# Patient Record
Sex: Female | Born: 1965
Health system: Southern US, Community
[De-identification: ages and names within clinical notes are randomized; demographics above are authoritative.]

## PROBLEM LIST (undated history)

## (undated) DIAGNOSIS — R011 Cardiac murmur, unspecified: Secondary | ICD-10-CM

## (undated) DIAGNOSIS — Z86010 Personal history of colonic polyps: Principal | ICD-10-CM

## (undated) DIAGNOSIS — I10 Essential (primary) hypertension: Secondary | ICD-10-CM

## (undated) DIAGNOSIS — I809 Phlebitis and thrombophlebitis of unspecified site: Secondary | ICD-10-CM

## (undated) DIAGNOSIS — K219 Gastro-esophageal reflux disease without esophagitis: Secondary | ICD-10-CM

## (undated) DIAGNOSIS — T7840XA Allergy, unspecified, initial encounter: Secondary | ICD-10-CM

## (undated) HISTORY — DX: Gastro-esophageal reflux disease without esophagitis: K21.9

## (undated) HISTORY — DX: Phlebitis and thrombophlebitis of unspecified site: I80.9

## (undated) HISTORY — DX: Cardiac murmur, unspecified: R01.1

## (undated) HISTORY — PX: OTHER SURGICAL HISTORY: SHX169

## (undated) HISTORY — DX: Essential (primary) hypertension: I10

## (undated) HISTORY — DX: Personal history of colonic polyps: Z86.010

## (undated) HISTORY — PX: ABDOMINAL HYSTERECTOMY: SHX81

## (undated) HISTORY — DX: Allergy, unspecified, initial encounter: T78.40XA

---

## 1997-10-05 ENCOUNTER — Emergency Department (HOSPITAL_COMMUNITY): Admission: EM | Admit: 1997-10-05 | Discharge: 1997-10-05 | Payer: Self-pay | Admitting: Emergency Medicine

## 1997-10-09 ENCOUNTER — Other Ambulatory Visit: Admission: RE | Admit: 1997-10-09 | Discharge: 1997-10-09 | Payer: Self-pay | Admitting: Family Medicine

## 1998-10-20 ENCOUNTER — Other Ambulatory Visit: Admission: RE | Admit: 1998-10-20 | Discharge: 1998-10-20 | Payer: Self-pay | Admitting: Family Medicine

## 1998-10-27 ENCOUNTER — Encounter: Payer: Self-pay | Admitting: Thoracic Surgery

## 1998-10-29 ENCOUNTER — Ambulatory Visit (HOSPITAL_COMMUNITY): Admission: RE | Admit: 1998-10-29 | Discharge: 1998-10-29 | Payer: Self-pay | Admitting: Thoracic Surgery

## 2000-10-26 ENCOUNTER — Other Ambulatory Visit: Admission: RE | Admit: 2000-10-26 | Discharge: 2000-10-26 | Payer: Self-pay | Admitting: Family Medicine

## 2001-10-07 ENCOUNTER — Emergency Department (HOSPITAL_COMMUNITY): Admission: EM | Admit: 2001-10-07 | Discharge: 2001-10-07 | Payer: Self-pay

## 2001-10-17 ENCOUNTER — Observation Stay (HOSPITAL_COMMUNITY): Admission: RE | Admit: 2001-10-17 | Discharge: 2001-10-18 | Payer: Self-pay | Admitting: Obstetrics and Gynecology

## 2003-10-27 ENCOUNTER — Ambulatory Visit (HOSPITAL_COMMUNITY): Admission: RE | Admit: 2003-10-27 | Discharge: 2003-10-27 | Payer: Self-pay | Admitting: Gastroenterology

## 2012-04-10 ENCOUNTER — Other Ambulatory Visit: Payer: Self-pay | Admitting: Obstetrics and Gynecology

## 2013-04-11 ENCOUNTER — Other Ambulatory Visit: Payer: Self-pay | Admitting: Obstetrics and Gynecology

## 2013-04-22 ENCOUNTER — Other Ambulatory Visit: Payer: Self-pay | Admitting: Obstetrics and Gynecology

## 2013-04-22 DIAGNOSIS — R928 Other abnormal and inconclusive findings on diagnostic imaging of breast: Secondary | ICD-10-CM

## 2013-05-14 ENCOUNTER — Ambulatory Visit
Admission: RE | Admit: 2013-05-14 | Discharge: 2013-05-14 | Disposition: A | Discharge status: Home / Self Care | Payer: BC Managed Care – PPO | Source: Ambulatory Visit | Attending: Obstetrics and Gynecology | Admitting: Obstetrics and Gynecology

## 2013-05-14 DIAGNOSIS — R928 Other abnormal and inconclusive findings on diagnostic imaging of breast: Secondary | ICD-10-CM

## 2013-05-16 ENCOUNTER — Other Ambulatory Visit: Payer: Self-pay

## 2013-10-20 ENCOUNTER — Ambulatory Visit (INDEPENDENT_AMBULATORY_CARE_PROVIDER_SITE_OTHER): Payer: BC Managed Care – PPO | Admitting: Emergency Medicine

## 2013-10-20 VITALS — BP 124/70 | HR 72 | Temp 98.8°F | Resp 18 | Ht 63.0 in | Wt 168.0 lb

## 2013-10-20 DIAGNOSIS — L639 Alopecia areata, unspecified: Secondary | ICD-10-CM

## 2013-10-20 LAB — POCT SKIN KOH: SKIN KOH, POC: NEGATIVE

## 2013-10-20 LAB — POCT CBC
Granulocyte percent: 47 %G (ref 37–80)
HEMATOCRIT: 36.6 % — AB (ref 37.7–47.9)
HEMOGLOBIN: 11.6 g/dL — AB (ref 12.2–16.2)
LYMPH, POC: 1.6 (ref 0.6–3.4)
MCH, POC: 31.4 pg — AB (ref 27–31.2)
MCHC: 31.7 g/dL — AB (ref 31.8–35.4)
MCV: 99 fL — AB (ref 80–97)
MID (cbc): 0.3 (ref 0–0.9)
MPV: 7.9 fL (ref 0–99.8)
POC GRANULOCYTE: 1.6 — AB (ref 2–6.9)
POC LYMPH %: 45.7 % (ref 10–50)
POC MID %: 7.3 %M (ref 0–12)
Platelet Count, POC: 386 10*3/uL (ref 142–424)
RBC: 3.7 M/uL — AB (ref 4.04–5.48)
RDW, POC: 12.8 %
WBC: 3.5 10*3/uL — AB (ref 4.6–10.2)

## 2013-10-20 LAB — VITAMIN B12: Vitamin B-12: 523 pg/mL (ref 211–911)

## 2013-10-20 LAB — FERRITIN: Ferritin: 112 ng/mL (ref 10–291)

## 2013-10-20 LAB — TSH: TSH: 1.696 u[IU]/mL (ref 0.350–4.500)

## 2013-10-20 LAB — GLUCOSE, POCT (MANUAL RESULT ENTRY): POC GLUCOSE: 86 mg/dL (ref 70–99)

## 2013-10-20 LAB — POCT SEDIMENTATION RATE: POCT SED RATE: 40 mm/h — AB (ref 0–22)

## 2013-10-20 MED ORDER — FLUOCINONIDE 0.05 % EX GEL: 1.0000 "application " | Freq: Two times a day (BID) | CUTANEOUS | Status: DC

## 2013-10-20 NOTE — Progress Notes (Addendum)
   Subjective:    Patient ID: Haley Flores, female    DOB: 08-26-1965, 48 y.o.   MRN: [id number]  HPI 48 year old female patient presents with alopecia. She noticed it yesterday when she was showering. She has little patches on her scalp from where she has lost hair. Has never noticed hair loss before.    Review of Systems     Objective:   Physical Exam there are patches of alopecia areata on the top of the head and side of the head. There does not appear to be any cracking of the hairs themselves there is no scaling of the scalp there does appear to be some atrophy of the skin in these areas        Results for orders placed in visit on 10/20/13  POCT CBC      Result Value Ref Range   WBC 3.5 (*) 4.6 - 10.2 K/uL   Lymph, poc 1.6  0.6 - 3.4   POC LYMPH PERCENT 45.7  10 - 50 %L   MID (cbc) 0.3  0 - 0.9   POC MID % 7.3  0 - 12 %M   POC Granulocyte 1.6 (*) 2 - 6.9   Granulocyte percent 47.0  37 - 80 %G   RBC 3.70 (*) 4.04 - 5.48 M/uL   Hemoglobin 11.6 (*) 12.2 - 16.2 g/dL   HCT, POC 36.6 (*) 37.7 - 47.9 %   MCV 99.0 (*) 80 - 97 fL   MCH, POC 31.4 (*) 27 - 31.2 pg   MCHC 31.7 (*) 31.8 - 35.4 g/dL   RDW, POC 12.8     Platelet Count, POC 386  142 - 424 K/uL   MPV 7.9  0 - 99.8 fL  GLUCOSE, POCT (MANUAL RESULT ENTRY)      Result Value Ref Range   POC Glucose 86  70 - 99 mg/dl  POCT SKIN KOH      Result Value Ref Range   Skin KOH, POC Negative      Assessment & Plan:  Patient presents with typical alopecia areata. Routine labs were done. Referral made to dermatology will treat with Lidex gel pending her Derm appt. Will also advise a multivitamin with iron. She was given a Lidex gel to apply twice a day pending her appointment with the dermatologist. Her sedimentation rate is 40 so certainly she could have some type of inflammatory disorder

## 2013-10-20 NOTE — Patient Instructions (Signed)
Alopecia Areata Alopecia areata is a self-destructing (autoimmune) disease that results in the loss of hair. In this condition your body's immune system attacks the hair follicle. The hair follicle is responsible for growing hair. Hair loss can occur on the scalp and other parts of the body. It usually starts as one or more small, round, smooth patches of hair loss. It occurs in males and females of all ages and races, but usually starts before age 49. The scalp is the most commonly affected area, but the beard or any hair-bearing site can be affected. This type of hair loss does not leave scars where the hair was lost.  Many people with alopecia areata only have a few patches of hair loss. In others, extensive patchy hair loss occurs. In a few people, all scalp hair is lost (alopecia totalis), or hair is lost from the entire scalp and body (alopecia universalis). No matter how widespread the hair loss, the hair follicles remain alive and are ready to resume normal hair production whenever they receive the correct signal. Hair re-growth may occur without treatment and can even restart after years of hair loss.  CAUSES  It is thought that something triggers the immune system to stop hair growth. It is not always known what the cause is. Some people have genetic markers that can increase the chance of developing alopecia areata. Alopecia areata often occurs in families whose members have had:  Asthma.  Hay fever.  Atopic eczema.  Some autoimmune diseases may also be a trigger, such as:  Thyroid disease.  Diabetes.  Rheumatoid arthritis.  Lupus erythematosus.  Vitiligo.  Pernicious anemia.  Addison's disease. OTHER SYMPTOMS In some people, the nail beds may develop rows of tiny dents (stippling) or the nail beds can become distorted. Other than the hair and nail beds, no other body part is affected.  PROGNOSIS  Alopecia areata is not medically disabling. People with alopecia areata are  usually in excellent health. Hair loss can be emotionally difficult. The National Alopecia Areata Foundation has resources available to help individuals and families with alopecia areata. Their goal is to help people with the condition live full, productive lives. There are many successful, well-adjusted, contented people living with Alopecia areata. Alopecia areata can be overcome with:  A positive self image.  Sound medical facts.  The support of others, such as:  Sometimes professional counseling is helpful to develop one's self-confidence and positive self-image. TREATMENT  There is no cure for alopecia areata. There are several available treatments. Treatments are most effective in milder cases. No treatment is effective for everyone. Choice of treatment depends mainly on a person's age and the extent of their hair loss. Alopecia areata occurs in two forms:   A mild patchy form where less than 50 percent of scalp hair is lost.  An extensive form where greater than 50 percent of scalp hair is lost. These two forms of alopecia areata behave quite differently, and the choice of treatment depends on which form is present. Current treatments do not turn alopecia areata off. They can stimulate the hair follicle to produce hair.  Some medications used to treat mild cases include:  Cortisone injections. The most common treatment is the injection of cortisone into the bare skin patches. The injections are usually given by a caregiver specializing in skin issues (dermatologist). This caregiver will use a tiny needle to give multiple injections into the skin in and around the bare patches. The injections are repeated once a month.   If new hair growth occurs, it is usually visible within 4 weeks. Treatment does not prevent new patches of hair loss from developing. There are few side effects from local cortisone injections. Occasionally, temporary dents (depressions) in the skin result from the local  injections, but these dents can fill in by themselves.  Topical minoxidil. Five percent topical minoxidil solution applied twice daily may grow hair in alopecia areata. Scalp, eyebrows, and beard hair may respond. If scalp hair re-grows completely, treatment can be stopped. Response may improve if topical cortisone cream is applied 30 minutes after the minoxidil. Topical minoxidil is safe, easy to use, and does not lower blood pressure in persons with normal blood pressure. Minoxidil can lead to unwanted facial hair growth in some people.  Anthralin cream or ointment. Another treatment is the application of anthralin cream or ointment. Anthralin is a tar-like substance that has been used widely for psoriasis. Anthralin is applied to the bare patches once daily. It is washed off after a short time, usually 30 to 60 minutes later. If new hair growth occurs, it is seen in 8 to 12 weeks. Anthralin can be irritating to the skin. It can cause temporary, brownish discoloration of the treated skin. By using short treatment times, skin irritation and skin staining are reduced without decreasing effectiveness. Care must be taken not to get anthralin in the eyes. Some of the medications used for more extensive cases where there is greater than 50% hair loss include:  Cortisone pills. Cortisone pills are sometimes given for extensive scalp hair loss. Cortisone taken internally is much stronger than local injections of cortisone into the skin. It is necessary to discuss possible side effects of cortisone pills with your caregiver. In general, however, cortisone pills are used in relatively few patients with alopecia areata due to health risks from prolonged use. Also, hair that has grown is likely to fall out when the cortisone pills are stopped.  Topical minoxidil. See previous explanation under mild, patchy alopecia areata. However, minoxidil is not effective in total loss of scalp hair (alopecia totalis).  Topical  immunotherapy. Another method of treating alopecia areata or alopecia totalis/universalis involves producing an allergic rash or allergic contact dermatitis. Chemicals such as diphencyprone (DPCP) or squaric acid dibutyl ester (SADBE) are applied to the scalp to produce an allergic rash which resembles poison oak or ivy. Approximately 40% of patients treated with topical immunotherapy will re-grow scalp hair after about 6 months of treatment. Those who do successfully re-grow scalp hair will need to continue treatment to maintain hair re-growth.  Wigs. For extensive hair loss, a wig can be an important option for some people. Proper attention will make a quality wig look completely natural. A wig will need to be cut, thinned, and styled. To keep a net base wig from falling off, special double-sided tape can be purchased in beauty supply outlets and fastened to the inside of the wig.  For those with completely bare heads, there are suction caps to which any wig can be attached. There are also entire suction cap wig units. FOR MORE INFORMATION National Alopecia Areata Foundation: https://www.berry.org/ Document Released: 11/20/2003 Document Revised: 07/10/2011 Document Reviewed: 06/23/2008 [facility] Patient Information 2015 [place], [place]. This information is not intended to replace advice given to you by your health care Izel Eisenhardt. Make sure you discuss any questions you have with your health care Jazmeen Axtell.

## 2013-10-21 LAB — ANA: Anti Nuclear Antibody(ANA): NEGATIVE

## 2014-04-15 ENCOUNTER — Other Ambulatory Visit: Payer: Self-pay | Admitting: Obstetrics and Gynecology

## 2014-04-16 LAB — CYTOLOGY - PAP

## 2014-06-08 ENCOUNTER — Ambulatory Visit (INDEPENDENT_AMBULATORY_CARE_PROVIDER_SITE_OTHER): Payer: BLUE CROSS/BLUE SHIELD | Admitting: Family Medicine

## 2014-06-08 VITALS — BP 140/80 | HR 69 | Temp 97.9°F | Resp 16 | Ht 62.5 in | Wt 175.4 lb

## 2014-06-08 DIAGNOSIS — T7840XA Allergy, unspecified, initial encounter: Secondary | ICD-10-CM

## 2014-06-08 DIAGNOSIS — L299 Pruritus, unspecified: Secondary | ICD-10-CM

## 2014-06-08 DIAGNOSIS — L308 Other specified dermatitis: Secondary | ICD-10-CM

## 2014-06-08 MED ORDER — METHYLPREDNISOLONE (PAK) 4 MG PO TABS: ORAL_TABLET | ORAL | Status: DC

## 2014-06-08 MED ORDER — METHYLPREDNISOLONE ACETATE 80 MG/ML IJ SUSP
80.0000 mg | Freq: Once | INTRAMUSCULAR | Status: AC
  Administered 2014-06-08: 80 mg via INTRAMUSCULAR

## 2014-06-08 MED ORDER — TRIAMCINOLONE ACETONIDE 0.1 % EX CREA: 1.0000 "application " | TOPICAL_CREAM | Freq: Two times a day (BID) | CUTANEOUS | Status: DC

## 2014-06-08 MED ORDER — HYDROXYZINE PAMOATE 50 MG PO CAPS: 50.0000 mg | ORAL_CAPSULE | Freq: Three times a day (TID) | ORAL | Status: DC | PRN

## 2014-06-08 NOTE — Progress Notes (Signed)
Chief Complaint:  Chief Complaint  Patient presents with  . Rash    Started using [employer] 1 week ago  . Itching    HPI: Haley Flores is a 49 y.o. female who is here for  1 week history of rash after using new softner  She used Albertine Patricia, she states she has had itching and redness. Has been on ACEI for a long time.  SHe has tried benadryl without releif, has no SOB or CP.  Past Medical History  Diagnosis Date  . Hypertension    Past Surgical History  Procedure Laterality Date  . Abdominal hysterectomy     History   Social History  . Marital Status: Married    Spouse Name: N/A  . Number of Children: N/A  . Years of Education: N/A   Social History Main Topics  . Smoking status: Never Smoker   . Smokeless tobacco: Not on file  . Alcohol Use: No  . Drug Use: No  . Sexual Activity: Not on file   Other Topics Concern  . None   Social History Narrative   Family History  Problem Relation Age of Onset  . Hyperlipidemia Mother   . Hypertension Mother   . Hypertension Father    No Known Allergies Prior to Admission medications   Medication Sig Start Date End Date Taking? Authorizing Provider  benazepril (LOTENSIN) 20 MG tablet Take 20 mg by mouth daily.   Yes Historical Provider, MD  Diethylpropion HCl (TENUATE PO) Take by mouth once.   Yes Historical Provider, MD  Estradiol (ELESTRIN) 0.52 MG/0.87 GM (0.06%) GEL Apply topically daily.    Yes Historical Provider, MD  fluocinonide gel (LIDEX) 5.46 % Apply 1 application topically 2 (two) times daily. 10/20/13  Yes Darlyne Russian, MD     ROS: The patient denies fevers, chills, night sweats, unintentional weight loss, chest pain, palpitations, wheezing, dyspnea on exertion, nausea, vomiting, abdominal pain, dysuria, hematuria, melena, numbness, weakness, or tingling.   All other systems have been reviewed and were otherwise negative with the exception of those mentioned in the HPI and as above.     PHYSICAL EXAM: Filed Vitals:   06/08/14 0923  BP: 140/80  Pulse: 69  Temp: 97.9 F (36.6 C)  Resp: 16   Filed Vitals:   06/08/14 0923  Height: 5' 2.5" (1.588 m)  Weight: 175 lb 6.4 oz (79.561 kg)   Body mass index is 31.55 kg/(m^2).  General: Alert, no acute distress HEENT:  Normocephalic, atraumatic, oropharynx patent. EOMI, PERRLA Cardiovascular:  Regular rate and rhythm, no rubs murmurs or gallops.  No Carotid bruits, radial pulse intact. No pedal edema.  Respiratory: Clear to auscultation bilaterally.  No wheezes, rales, or rhonchi.  No cyanosis, no use of accessory musculature GI: No organomegaly, abdomen is soft and non-tender, positive bowel sounds.  No masses. Skin: + pruritic maculopapular rashes. Neurologic: Facial musculature symmetric. Psychiatric: Patient is appropriate throughout our interaction. Lymphatic: No cervical lymphadenopathy Musculoskeletal: Gait intact.   LABS: Results for orders placed or performed in visit on 04/15/14  Cytology - PAP  Result Value Ref Range   CYTOLOGY - PAP PAP RESULT      EKG/XRAY:   Primary read interpreted by Dr. Marin Comment at [facility].   ASSESSMENT/PLAN: Encounter Diagnoses  Name Primary?  . Allergic reaction, initial encounter Yes  . Pruritic dermatitis    Depomedrol 80 mg IM x 1 Vistaril , stop benadryl  If no improvement in next few  days then can take medrol dose taper F/u prn   Gross sideeffects, risk and benefits, and alternatives of medications d/w patient. Patient is aware that all medications have potential sideeffects and we are unable to predict every sideeffect or drug-drug interaction that may occur.  Nimrit Kehres, [place], DO 06/10/2014 1:10 PM

## 2014-07-28 ENCOUNTER — Ambulatory Visit (INDEPENDENT_AMBULATORY_CARE_PROVIDER_SITE_OTHER): Payer: BLUE CROSS/BLUE SHIELD | Admitting: Family Medicine

## 2014-07-28 VITALS — BP 152/100 | HR 73 | Temp 98.2°F | Resp 20 | Ht 63.0 in | Wt 172.5 lb

## 2014-07-28 DIAGNOSIS — I10 Essential (primary) hypertension: Secondary | ICD-10-CM | POA: Diagnosis not present

## 2014-07-28 MED ORDER — HYDROCHLOROTHIAZIDE 12.5 MG PO CAPS: 12.5000 mg | ORAL_CAPSULE | Freq: Every day | ORAL | Status: AC

## 2014-07-28 NOTE — Patient Instructions (Addendum)
Keep a record of your blood pressures outside of the office and bring them to the next office visit. Start new medicine (HCTZ) in addition to your Lotensin. You should receive a call or letter about your lab results within the next week to 10 days.  Return to the clinic or go to the nearest emergency room if any of your symptoms worsen or new symptoms occur.   Hypertension Hypertension, commonly called high blood pressure, is when the force of blood pumping through your arteries is too strong. Your arteries are the blood vessels that carry blood from your heart throughout your body. A blood pressure reading consists of a higher number over a lower number, such as 110/72. The higher number (systolic) is the pressure inside your arteries when your heart pumps. The lower number (diastolic) is the pressure inside your arteries when your heart relaxes. Ideally you want your blood pressure below 120/80. Hypertension forces your heart to work harder to pump blood. Your arteries may become narrow or stiff. Having hypertension puts you at risk for heart disease, stroke, and other problems.  RISK FACTORS Some risk factors for high blood pressure are controllable. Others are not.  Risk factors you cannot control include:   Race. You may be at higher risk if you are African American.  Age. Risk increases with age.  Gender. Men are at higher risk than women before age 82 years. After age 7, women are at higher risk than men. Risk factors you can control include:  Not getting enough exercise or physical activity.  Being overweight.  Getting too much fat, sugar, calories, or salt in your diet.  Drinking too much alcohol. SIGNS AND SYMPTOMS Hypertension does not usually cause signs or symptoms. Extremely high blood pressure (hypertensive crisis) may cause headache, anxiety, shortness of breath, and nosebleed. DIAGNOSIS  To check if you have hypertension, your health care provider will measure your  blood pressure while you are seated, with your arm held at the level of your heart. It should be measured at least twice using the same arm. Certain conditions can cause a difference in blood pressure between your right and left arms. A blood pressure reading that is higher than normal on one occasion does not mean that you need treatment. If one blood pressure reading is high, ask your health care provider about having it checked again. TREATMENT  Treating high blood pressure includes making lifestyle changes and possibly taking medicine. Living a healthy lifestyle can help lower high blood pressure. You may need to change some of your habits. Lifestyle changes may include:  Following the DASH diet. This diet is high in fruits, vegetables, and whole grains. It is low in salt, red meat, and added sugars.  Getting at least 2 hours of brisk physical activity every week.  Losing weight if necessary.  Not smoking.  Limiting alcoholic beverages.  Learning ways to reduce stress. If lifestyle changes are not enough to get your blood pressure under control, your health care provider may prescribe medicine. You may need to take more than one. Work closely with your health care provider to understand the risks and benefits. HOME CARE INSTRUCTIONS  Have your blood pressure rechecked as directed by your health care provider.   Take medicines only as directed by your health care provider. Follow the directions carefully. Blood pressure medicines must be taken as prescribed. The medicine does not work as well when you skip doses. Skipping doses also puts you at risk for problems.  Do not smoke.   Monitor your blood pressure at home as directed by your health care provider. SEEK MEDICAL CARE IF:   You think you are having a reaction to medicines taken.  You have recurrent headaches or feel dizzy.  You have swelling in your ankles.  You have trouble with your vision. SEEK IMMEDIATE MEDICAL  CARE IF:  You develop a severe headache or confusion.  You have unusual weakness, numbness, or feel faint.  You have severe chest or abdominal pain.  You vomit repeatedly.  You have trouble breathing. MAKE SURE YOU:   Understand these instructions.  Will watch your condition.  Will get help right away if you are not doing well or get worse. Document Released: 04/17/2005 Document Revised: 09/01/2013 Document Reviewed: 02/07/2013 [facility] Patient Information 2015 [place], [place]. This information is not intended to replace advice given to you by your health care provider. Make sure you discuss any questions you have with your health care provider.

## 2014-07-28 NOTE — Progress Notes (Signed)
Subjective:   This chart was scribed for Haley Ray, MD by Erling Conte, Medical Scribe. This patient was seen in Room 7 and the patient's care was started at 5:06 PM.   Patient ID: Haley Flores, female    DOB: Mar 06, 1966, 49 y.o.   MRN: [id number]  Chief Complaint  Patient presents with  . Hypertension    BP is higher than ususal.  checked at work today and told to follow up for this    HPI Haley Flores is a 49 y.o. female is here for elevated BP. She was Last seen February 8th by Dr. Truman Hayward for allergic rxn her BP at that time was 140/80. She takes benazapril 20 mg QD which is managed by Dr. Helane Rima. Pt denies any side effects from the medication. She was put on it about 3 years ago. She states was previously taking Triamterene HCT but had a dizziness side effect and was taken off of it. Pt checked her BP routinely at work today and it was 180/120, she states they checked it later and it was 170/110. In the office today upon arrival is was 152/100. She denies any HA, dizziness, lightheadedness, vision changes, or chest pain when her BP is high. Pt does not routinely check her BP at home. Pt notes when she was checking it at home it was elevated but not as high as it has been lately. She denies any weakness in extremities, facial droop, or slurred speech.    PCP: Cyril Mourning, MD   Patient Active Problem List   Diagnosis Date Noted  . Alopecia areata 10/20/2013   Past Medical History  Diagnosis Date  . Hypertension    Past Surgical History  Procedure Laterality Date  . Abdominal hysterectomy     No Known Allergies Prior to Admission medications   Medication Sig Start Date End Date Taking? Authorizing Provider  benazepril (LOTENSIN) 20 MG tablet Take 20 mg by mouth daily.   Yes Historical Provider, MD  Diethylpropion HCl (TENUATE PO) Take by mouth once.   Yes Historical Provider, MD  Estradiol (ELESTRIN) 0.52 MG/0.87 GM (0.06%) GEL Apply topically daily.    Yes  Historical Provider, MD  hydrOXYzine (VISTARIL) 50 MG capsule Take 1 capsule (50 mg total) by mouth 3 (three) times daily as needed. 06/08/14  Yes Thao P Le, DO  triamcinolone cream (KENALOG) 0.1 % Apply 1 application topically 2 (two) times daily. Do not use on face 06/08/14  Yes Thao P Le, DO   History   Social History  . Marital Status: Married    Spouse Name: N/A  . Number of Children: N/A  . Years of Education: N/A   Occupational History  . Not on file.   Social History Main Topics  . Smoking status: Never Smoker   . Smokeless tobacco: Not on file  . Alcohol Use: No  . Drug Use: No  . Sexual Activity: Not on file   Other Topics Concern  . Not on file   Social History Narrative    Review of Systems  Constitutional: Negative for fatigue and unexpected weight change.  Respiratory: Negative for chest tightness and shortness of breath.   Cardiovascular: Negative for chest pain, palpitations and leg swelling.  Gastrointestinal: Negative for abdominal pain and blood in stool.  Neurological: Negative for dizziness, syncope, weakness, light-headedness and headaches.       Objective:   Physical Exam  Constitutional: She is oriented to person, place, and time. She appears well-developed  and well-nourished.  HENT:  Head: Normocephalic and atraumatic.  Eyes: Conjunctivae and EOM are normal. Pupils are equal, round, and reactive to light.  Neck: Carotid bruit is not present.  Cardiovascular: Normal rate, regular rhythm, normal heart sounds and intact distal pulses.   Pulmonary/Chest: Effort normal and breath sounds normal.  Abdominal: Soft. She exhibits no pulsatile midline mass. There is no tenderness.  Neurological: She is alert and oriented to person, place, and time.  Skin: Skin is warm and dry.  Psychiatric: She has a normal mood and affect. Her behavior is normal.  Vitals reviewed.   Filed Vitals:   07/28/14 1536  BP: 152/100  Pulse: 73  Temp: 98.2 F (36.8 C)    TempSrc: Oral  Resp: 20  Height: 5\' 3"  (1.6 m)  Weight: 172 lb 8 oz (78.245 kg)  SpO2: 97%      Assessment & Plan:   Haley Flores is a 49 y.o. female Essential hypertension - Plan: BASIC METABOLIC PANEL WITH GFR, hydrochlorothiazide (MICROZIDE) 12.5 MG capsule  - asymptomatic. likely has had elevated readings for some time. Did not tolerate triamterene/hctz  Combo d/t lightheadedness - may have bee too strong.   -add HCTZ 12.5mg  qd., check BMP.   -ambulatory BP's.   - follow up here or PCP in next few weeks.   -RTC/ER precautions.   Meds ordered this encounter  Medications  . hydrochlorothiazide (MICROZIDE) 12.5 MG capsule    Sig: Take 1 capsule (12.5 mg total) by mouth daily.    Dispense:  30 capsule    Refill:  1   Patient Instructions  Keep a record of your blood pressures outside of the office and bring them to the next office visit. Start new medicine (HCTZ) in addition to your Lotensin. You should receive a call or letter about your lab results within the next week to 10 days.  Return to the clinic or go to the nearest emergency room if any of your symptoms worsen or new symptoms occur.   Hypertension Hypertension, commonly called high blood pressure, is when the force of blood pumping through your arteries is too strong. Your arteries are the blood vessels that carry blood from your heart throughout your body. A blood pressure reading consists of a higher number over a lower number, such as 110/72. The higher number (systolic) is the pressure inside your arteries when your heart pumps. The lower number (diastolic) is the pressure inside your arteries when your heart relaxes. Ideally you want your blood pressure below 120/80. Hypertension forces your heart to work harder to pump blood. Your arteries may become narrow or stiff. Having hypertension puts you at risk for heart disease, stroke, and other problems.  RISK FACTORS Some risk factors for high blood pressure are  controllable. Others are not.  Risk factors you cannot control include:   Race. You may be at higher risk if you are African American.  Age. Risk increases with age.  Gender. Men are at higher risk than women before age 70 years. After age 16, women are at higher risk than men. Risk factors you can control include:  Not getting enough exercise or physical activity.  Being overweight.  Getting too much fat, sugar, calories, or salt in your diet.  Drinking too much alcohol. SIGNS AND SYMPTOMS Hypertension does not usually cause signs or symptoms. Extremely high blood pressure (hypertensive crisis) may cause headache, anxiety, shortness of breath, and nosebleed. DIAGNOSIS  To check if you have hypertension, your health  care provider will measure your blood pressure while you are seated, with your arm held at the level of your heart. It should be measured at least twice using the same arm. Certain conditions can cause a difference in blood pressure between your right and left arms. A blood pressure reading that is higher than normal on one occasion does not mean that you need treatment. If one blood pressure reading is high, ask your health care provider about having it checked again. TREATMENT  Treating high blood pressure includes making lifestyle changes and possibly taking medicine. Living a healthy lifestyle can help lower high blood pressure. You may need to change some of your habits. Lifestyle changes may include:  Following the DASH diet. This diet is high in fruits, vegetables, and whole grains. It is low in salt, red meat, and added sugars.  Getting at least 2 hours of brisk physical activity every week.  Losing weight if necessary.  Not smoking.  Limiting alcoholic beverages.  Learning ways to reduce stress. If lifestyle changes are not enough to get your blood pressure under control, your health care provider may prescribe medicine. You may need to take more than one.  Work closely with your health care provider to understand the risks and benefits. HOME CARE INSTRUCTIONS  Have your blood pressure rechecked as directed by your health care provider.   Take medicines only as directed by your health care provider. Follow the directions carefully. Blood pressure medicines must be taken as prescribed. The medicine does not work as well when you skip doses. Skipping doses also puts you at risk for problems.   Do not smoke.   Monitor your blood pressure at home as directed by your health care provider. SEEK MEDICAL CARE IF:   You think you are having a reaction to medicines taken.  You have recurrent headaches or feel dizzy.  You have swelling in your ankles.  You have trouble with your vision. SEEK IMMEDIATE MEDICAL CARE IF:  You develop a severe headache or confusion.  You have unusual weakness, numbness, or feel faint.  You have severe chest or abdominal pain.  You vomit repeatedly.  You have trouble breathing. MAKE SURE YOU:   Understand these instructions.  Will watch your condition.  Will get help right away if you are not doing well or get worse. Document Released: 04/17/2005 Document Revised: 09/01/2013 Document Reviewed: 02/07/2013 [facility] Patient Information 2015 [place], [place]. This information is not intended to replace advice given to you by your health care provider. Make sure you discuss any questions you have with your health care provider.     I personally performed the services described in this documentation, which was scribed in my presence. The recorded information has been reviewed and considered, and addended by me as needed.

## 2014-07-29 LAB — BASIC METABOLIC PANEL WITH GFR
BUN: 18 mg/dL (ref 6–23)
CALCIUM: 9.6 mg/dL (ref 8.4–10.5)
CO2: 27 mEq/L (ref 19–32)
Chloride: 102 mEq/L (ref 96–112)
Creat: 0.82 mg/dL (ref 0.50–1.10)
GFR, Est African American: >89 mL/min
GFR, Est Non African American: 84 mL/min
GLUCOSE: 80 mg/dL (ref 70–99)
Potassium: 4.2 mEq/L (ref 3.5–5.3)
Sodium: 137 mEq/L (ref 135–145)

## 2015-04-11 IMAGING — US US BREAST*L* LIMITED INC AXILLA
1 series · 4 of 4 positions shown · non-contrast
Comparison: 04/10/2012, 03/27/2011, 03/23/2010

CLINICAL DATA: Patient returns for evaluation of possible mass left
dated 04/11/2013.

EXAM:
DIGITAL DIAGNOSTIC  LEFT MAMMOGRAM
ULTRASOUND LEFT BREAST

[Series 1: us breast*left* limited inc axilla · 4 of 4 slices shown]
[im 1/4]
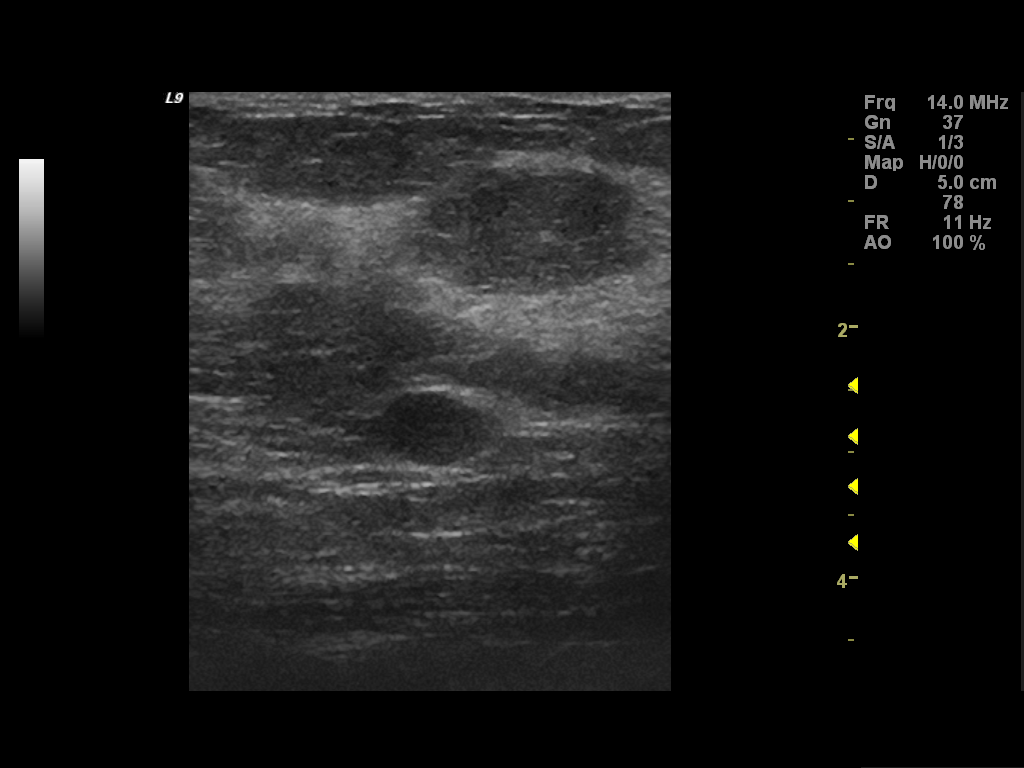
[im 2/4]
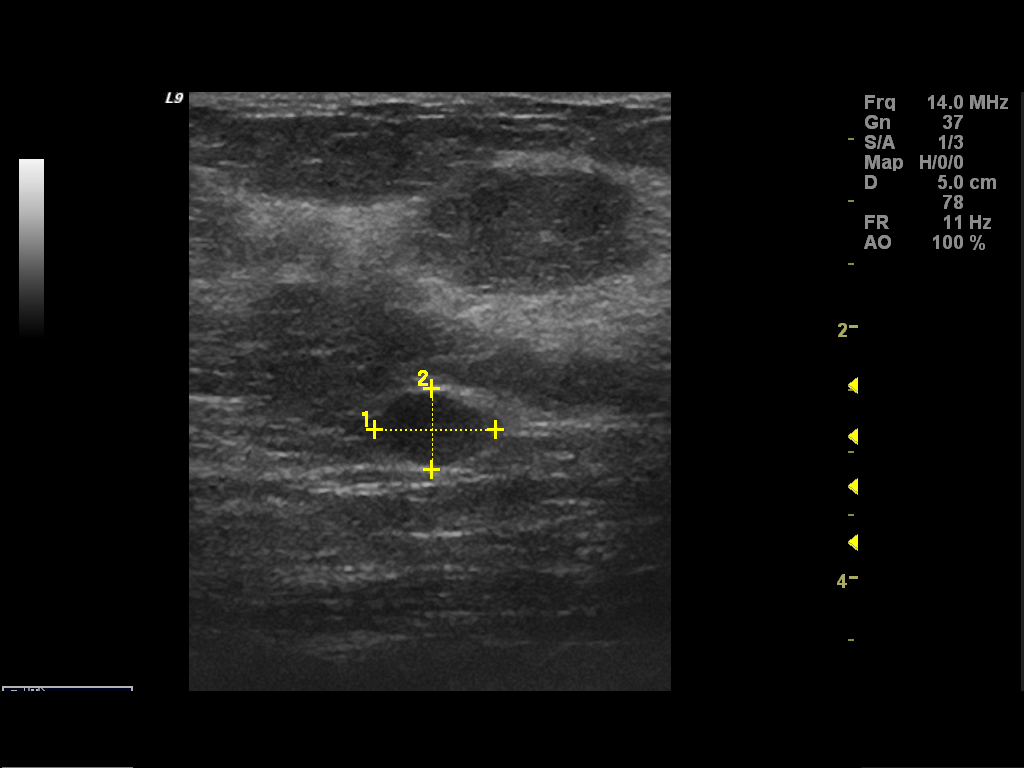
[im 3/4]
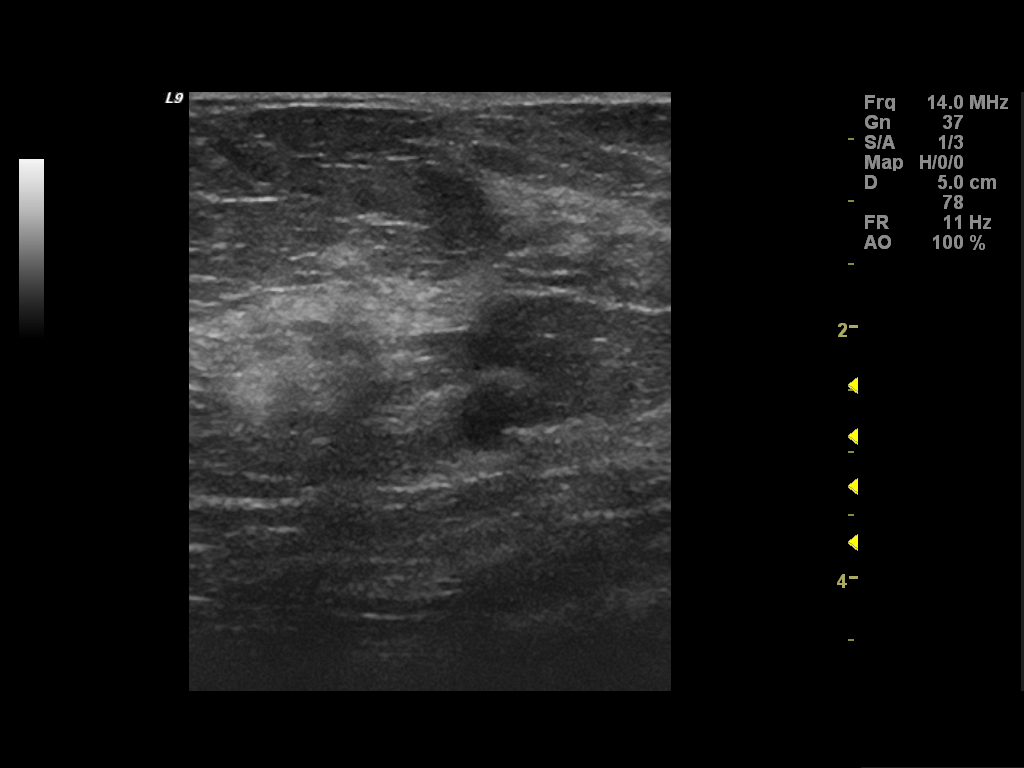
[im 4/4]
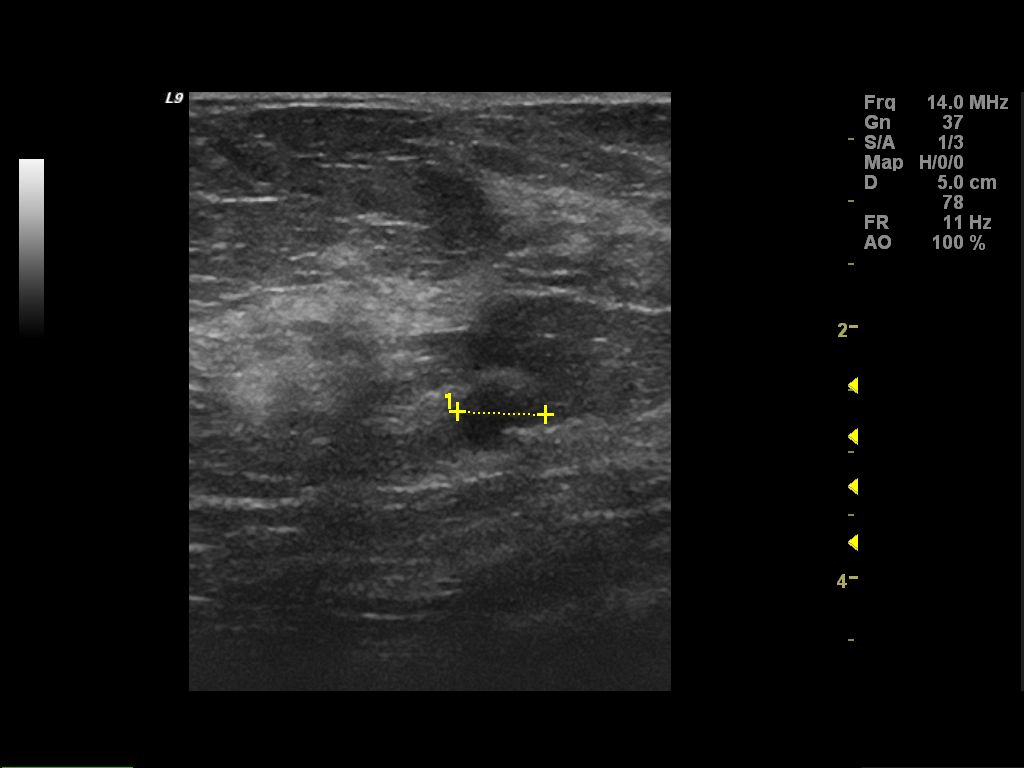

[4 of 4 positions shown; findings below may reference images not displayed]

ACR Breast Density Category c: The breast tissue is heterogeneously
dense, which may obscure small masses.
FINDINGS: Spot compression views demonstrated a partially circumscribed
partially obscured oval mass in the left upper outer quadrant
posteriorly.

On physical exam, no mass is palpated in the left breast.

Ultrasound is performed, showing an oval cyst at 2 o'clock 7 cm from
the left nipple measuring 10 x 6 x 7 mm.
IMPRESSION: Left breast cyst.

RECOMMENDATION:
Yearly screening mammography is suggested.

I have discussed the findings and recommendations with the patient.
Results were also provided in writing at the conclusion of the
visit. If applicable, a reminder letter will be sent to the patient
regarding the next appointment.

BI-RADS CATEGORY  2: Benign Finding(s)

## 2015-08-03 ENCOUNTER — Ambulatory Visit (INDEPENDENT_AMBULATORY_CARE_PROVIDER_SITE_OTHER): Payer: BLUE CROSS/BLUE SHIELD | Admitting: Physician Assistant

## 2015-08-03 VITALS — BP 152/94 | HR 87 | Temp 98.4°F | Resp 16 | Ht 63.0 in | Wt 179.8 lb

## 2015-08-03 DIAGNOSIS — R05 Cough: Secondary | ICD-10-CM | POA: Diagnosis not present

## 2015-08-03 DIAGNOSIS — G9331 Postviral fatigue syndrome: Secondary | ICD-10-CM

## 2015-08-03 DIAGNOSIS — R030 Elevated blood-pressure reading, without diagnosis of hypertension: Secondary | ICD-10-CM | POA: Diagnosis not present

## 2015-08-03 DIAGNOSIS — R5383 Other fatigue: Secondary | ICD-10-CM | POA: Diagnosis not present

## 2015-08-03 DIAGNOSIS — R059 Cough, unspecified: Secondary | ICD-10-CM

## 2015-08-03 DIAGNOSIS — IMO0001 Reserved for inherently not codable concepts without codable children: Secondary | ICD-10-CM

## 2015-08-03 DIAGNOSIS — G933 Postviral fatigue syndrome: Secondary | ICD-10-CM

## 2015-08-03 MED ORDER — BENZONATATE 100 MG PO CAPS: 100.0000 mg | ORAL_CAPSULE | Freq: Three times a day (TID) | ORAL | Status: DC | PRN

## 2015-08-03 MED ORDER — HYDROCOD POLST-CPM POLST ER 10-8 MG/5ML PO SUER: 5.0000 mL | Freq: Two times a day (BID) | ORAL | Status: DC | PRN

## 2015-08-03 NOTE — Progress Notes (Signed)
[facility] [address], [place] [address] [phone]  Date:  08/03/2015   Name:  Haley Flores   DOB:  07/01/1965   MRN:  [id number]  PCP:  Cyril Mourning, MD    Chief Complaint: Hoarse; congestion; and Cough   History of Present Illness:  This is a 50 y.o. female with PMH HTN, alopecia areata who is presenting with 1.5 weeks of chest congestion, cough. Feels like she needs to get something up but can't. Cough is dry. Denies sob or wheezing. Did have sore throat initially but this has resolved. Denies fever, chills. Sleep is being disturbed by coughing. Over the past few days she is getting hoarse as well. Overall she feels much better than when her symptoms first started.  Aggravating/alleviating factors: mucinex and robitussin but not helping History of asthma: no History of env allergies: no Tobacco use: no States she seems to catch a cold around this time every year.  Pt takes benazepril 20 mg and hydrochlorithiazide 12.5 mg. She has a bp monitor at home but does not check regularly. As far as I can tell her BP has been about the same at past 2 OVs over the past 1 year. PCP: Dr. Helane Rima   Review of Systems:  Review of Systems See HPI  Patient Active Problem List   Diagnosis Date Noted  . Alopecia areata 10/20/2013    Prior to Admission medications   Medication Sig Start Date End Date Taking? Authorizing Provider  benazepril (LOTENSIN) 20 MG tablet Take 20 mg by mouth daily.   Yes Historical Provider, MD  Diethylpropion HCl (TENUATE PO) Take by mouth once.   Yes Historical Provider, MD  Estradiol (ELESTRIN) 0.52 MG/0.87 GM (0.06%) GEL Apply topically daily.    Yes Historical Provider, MD  hydrochlorothiazide (MICROZIDE) 12.5 MG capsule Take 1 capsule (12.5 mg total) by mouth daily. 07/28/14  Yes Wendie Agreste, MD       Thao Susanne Borders, DO       Thao Susanne Borders, DO    No Known Allergies  Past Surgical History  Procedure Laterality Date  .  Abdominal hysterectomy      Social History  Substance Use Topics  . Smoking status: Never Smoker   . Smokeless tobacco: None  . Alcohol Use: No    Family History  Problem Relation Age of Onset  . Hyperlipidemia Mother   . Hypertension Mother   . Hypertension Father     Medication list has been reviewed and updated.  Physical Examination:  Physical Exam  Constitutional: She is oriented to person, place, and time. She appears well-developed and well-nourished. No distress.  HENT:  Head: Normocephalic and atraumatic.  Right Ear: Hearing, tympanic membrane, external ear and ear canal normal.  Left Ear: Hearing, tympanic membrane, external ear and ear canal normal.  Nose: Nose normal.  Mouth/Throat: Uvula is midline and mucous membranes are normal. Posterior oropharyngeal erythema present. No oropharyngeal exudate or posterior oropharyngeal edema.  Eyes: Conjunctivae and lids are normal. Right eye exhibits no discharge. Left eye exhibits no discharge. No scleral icterus.  Cardiovascular: Normal rate, regular rhythm, normal heart sounds and normal pulses.   No murmur heard. Pulmonary/Chest: Effort normal and breath sounds normal. No respiratory distress. She has no wheezes. She has no rhonchi. She has no rales.  Musculoskeletal: Normal range of motion.  Lymphadenopathy:       Head (right side): No submental, no submandibular and no tonsillar adenopathy present.  Head (left side): No submental, no submandibular and no tonsillar adenopathy present.    She has no cervical adenopathy.  Neurological: She is alert and oriented to person, place, and time.  Skin: Skin is warm, dry and intact. No lesion and no rash noted.  Psychiatric: She has a normal mood and affect. Her speech is normal and behavior is normal. Thought content normal.    BP 152/94 mmHg  Pulse 87  Temp(Src) 98.4 F (36.9 C) (Oral)  Resp 16  Ht 5\' 3"  (1.6 m)  Wt 179 lb 12.8 oz (81.557 kg)  BMI 31.86 kg/m2   SpO2 97%  BP recheck 148/94  Assessment and Plan:  1. Postviral syndrome 2. Cough  Symptoms overall improved since onset. Now with a lingering cough. Treat with tessalon and tussionex. Offered qvar, pt declined. Return in 7-10 days if symptoms do not improve or at any time if symptoms worsen.  - chlorpheniramine-HYDROcodone (TUSSIONEX PENNKINETIC ER) 10-8 MG/5ML SUER; Take 5 mLs by mouth every 12 (twelve) hours as needed for cough.  Dispense: 100 mL; Refill: 0 - benzonatate (TESSALON) 100 MG capsule; Take 1-2 capsules (100-200 mg total) by mouth 3 (three) times daily as needed for cough.  Dispense: 40 capsule; Refill: 0   3. Elevated BP BP elevated here. She will monitor BP daily at home and keep record. Follow up with Dr. Helane Rima if BP consistently >140/90.   Benjaman Pott Drenda Freeze, MHS [facility]  08/03/2015

## 2015-08-03 NOTE — Patient Instructions (Addendum)
Drink plenty of water (64 oz/day) and get plenty of rest. If you have been prescribed a cough syrup, do not drive or operate heavy machinery while using this medication. Tessalon three times a day as needed for cough - this will help to break up the mucus If your symptoms are not improving in 7-10 days, return to clinic.  Check your BP daily and keep a record. If consistently >140/90, make an appt with Dr. Helane Rima     IF you received an x-ray today, you will receive an invoice from [facility]. Please contact [facility] at [phone] with questions or concerns regarding your invoice.   IF you received labwork today, you will receive an invoice from [employer]. Please contact [facility] at [phone] with questions or concerns regarding your invoice.   Our billing staff will not be able to assist you with questions regarding bills from these companies.  You will be contacted with the lab results as soon as they are available. The fastest way to get your results is to activate your My Chart account. Instructions are located on the last page of this paperwork. If you have not heard from [place] regarding the results in 2 weeks, please contact this office.

## 2015-10-06 DIAGNOSIS — H5213 Myopia, bilateral: Secondary | ICD-10-CM | POA: Diagnosis not present

## 2016-04-21 DIAGNOSIS — Z1231 Encounter for screening mammogram for malignant neoplasm of breast: Secondary | ICD-10-CM | POA: Diagnosis not present

## 2016-04-21 DIAGNOSIS — Z13228 Encounter for screening for other metabolic disorders: Secondary | ICD-10-CM | POA: Diagnosis not present

## 2016-04-21 DIAGNOSIS — Z1212 Encounter for screening for malignant neoplasm of rectum: Secondary | ICD-10-CM | POA: Diagnosis not present

## 2016-04-21 DIAGNOSIS — Z131 Encounter for screening for diabetes mellitus: Secondary | ICD-10-CM | POA: Diagnosis not present

## 2016-04-21 DIAGNOSIS — Z13220 Encounter for screening for lipoid disorders: Secondary | ICD-10-CM | POA: Diagnosis not present

## 2016-04-21 DIAGNOSIS — Z01419 Encounter for gynecological examination (general) (routine) without abnormal findings: Secondary | ICD-10-CM | POA: Diagnosis not present

## 2016-04-21 DIAGNOSIS — Z130 Encounter for screening for diseases of the blood and blood-forming organs and certain disorders involving the immune mechanism: Secondary | ICD-10-CM | POA: Diagnosis not present

## 2016-04-21 DIAGNOSIS — N390 Urinary tract infection, site not specified: Secondary | ICD-10-CM | POA: Diagnosis not present

## 2016-04-21 DIAGNOSIS — Z6831 Body mass index (BMI) 31.0-31.9, adult: Secondary | ICD-10-CM | POA: Diagnosis not present

## 2016-09-12 LAB — LIPID PANEL
Cholesterol: 233 — AB (ref 0–200)
LDL CALC: 163
Triglycerides: 100 (ref 40–160)

## 2016-09-12 LAB — HEPATIC FUNCTION PANEL
ALT: 16 (ref 7–35)
AST: 19 (ref 13–35)
BILIRUBIN, TOTAL: 0.3

## 2016-09-12 LAB — CBC AND DIFFERENTIAL
HCT: 37 (ref 36–46)
HEMOGLOBIN: 12.6 (ref 12.0–16.0)
Platelets: 342 (ref 150–399)
WBC: 3.2

## 2016-09-12 LAB — BASIC METABOLIC PANEL
BUN: 17 (ref 4–21)
Creatinine: 0.8 (ref <=1.1)
Glucose: 82
Potassium: 4.4 (ref 3.4–5.3)
Sodium: 136 — AB (ref 137–147)

## 2016-09-12 LAB — HEMOGLOBIN A1C: Hemoglobin A1C: 5.9

## 2016-10-31 ENCOUNTER — Ambulatory Visit (HOSPITAL_COMMUNITY)
Admission: EM | Admit: 2016-10-31 | Discharge: 2016-10-31 | Disposition: A | Discharge status: Home / Self Care | Payer: BLUE CROSS/BLUE SHIELD | Attending: Internal Medicine | Admitting: Internal Medicine

## 2016-10-31 ENCOUNTER — Encounter (HOSPITAL_COMMUNITY): Payer: Self-pay | Admitting: *Deleted

## 2016-10-31 DIAGNOSIS — R059 Cough, unspecified: Secondary | ICD-10-CM

## 2016-10-31 DIAGNOSIS — R0982 Postnasal drip: Secondary | ICD-10-CM | POA: Diagnosis not present

## 2016-10-31 DIAGNOSIS — R05 Cough: Secondary | ICD-10-CM | POA: Diagnosis not present

## 2016-10-31 NOTE — ED Provider Notes (Signed)
CSN: [id number]     Arrival date & time 10/31/16  1135 History   First MD Initiated Contact with Patient 10/31/16 1326     Chief Complaint  Patient presents with  . Cough   (Consider location/radiation/quality/duration/timing/severity/associated sxs/prior Treatment) 51 year old female complaining of a cough for about 4 weeks. It is associated with PND and frequent clearing of the throat. Denies fever, history of smoking or asthma. Denies earache. She also has a irritation or discomfort in the anterior throat and upper chest. The only medication she is taking his Mucinex.      Past Medical History:  Diagnosis Date  . Hypertension    Past Surgical History:  Procedure Laterality Date  . ABDOMINAL HYSTERECTOMY     Family History  Problem Relation Age of Onset  . Hyperlipidemia Mother   . Hypertension Mother   . Hypertension Father    Social History  Substance Use Topics  . Smoking status: Never Smoker  . Smokeless tobacco: Not on file  . Alcohol use 0.0 oz/week     Comment: rarely   OB History    No data available     Review of Systems  Constitutional: Negative.  Negative for fever.  HENT: Positive for postnasal drip. Negative for congestion, ear pain and sore throat.   Respiratory: Positive for cough. Negative for shortness of breath.   Gastrointestinal: Negative.   Musculoskeletal: Negative.   Neurological: Negative.   All other systems reviewed and are negative.   Allergies  Patient has no known allergies.  Home Medications   Prior to Admission medications   Medication Sig Start Date End Date Taking? Authorizing Provider  benazepril (LOTENSIN) 20 MG tablet Take 20 mg by mouth daily.    [provider]  benzonatate (TESSALON) 100 MG capsule Take 1-2 capsules (100-200 mg total) by mouth 3 (three) times daily as needed for cough. 08/03/15   Ezekiel Slocumb, PA-C  chlorpheniramine-HYDROcodone (TUSSIONEX PENNKINETIC ER) 10-8 MG/5ML SUER Take 5 mLs by mouth  every 12 (twelve) hours as needed for cough. 08/03/15   Ezekiel Slocumb, PA-C  Diethylpropion HCl (TENUATE PO) Take by mouth once.    [provider]  Estradiol (ELESTRIN) 0.52 MG/0.87 GM (0.06%) GEL Apply topically daily.     [provider]  hydrochlorothiazide (MICROZIDE) 12.5 MG capsule Take 1 capsule (12.5 mg total) by mouth daily. 07/28/14   Wendie Agreste, MD  hydrOXYzine (VISTARIL) 50 MG capsule Take 1 capsule (50 mg total) by mouth 3 (three) times daily as needed. Patient not taking: Reported on 08/03/2015 06/08/14   Le, Thao P, DO  triamcinolone cream (KENALOG) 0.1 % Apply 1 application topically 2 (two) times daily. Do not use on face Patient not taking: Reported on 08/03/2015 06/08/14   Rikki Spearing P, DO   Meds Ordered and Administered this Visit  Medications - No data to display  BP 121/88 (BP Location: Right Arm)   Pulse 76   Temp 98.4 F (36.9 C) (Oral)   Resp 18   SpO2 98%  No data found.   Physical Exam  Constitutional: She is oriented to person, place, and time. She appears well-developed and well-nourished. No distress.  HENT:  Head: Normocephalic.  Right Ear: External ear normal.  Left Ear: External ear normal.  Mouth/Throat: No oropharyngeal exudate.  Minor erythema of the posterior oropharynx. More and streaky type fashion. Scant clear PND. No exudates or edema Bilateral TMs are mildly retracted. No erythema.  Neck: Normal range of motion. Neck  supple.  Cardiovascular: Normal rate, regular rhythm and intact distal pulses.   No murmur heard. Pulmonary/Chest: Effort normal. No respiratory distress. She has no rales.  Musculoskeletal: Normal range of motion.  Lymphadenopathy:    She has no cervical adenopathy.  Neurological: She is alert and oriented to person, place, and time. No cranial nerve deficit.  Skin: Skin is warm and dry.  Psychiatric: She has a normal mood and affect.  Nursing note and vitals reviewed.   Urgent Care Course      Procedures (including critical care time)  Labs Review Labs Reviewed - No data to display  Imaging Review No results found.   Visual Acuity Review  Right Eye Distance:   Left Eye Distance:   Bilateral Distance:    Right Eye Near:   Left Eye Near:    Bilateral Near:         MDM   1. Cough   2. PND (post-nasal drip)    You have frequent and persistent sinus drainage in the back of your throat. This is why your clearing her throat so often. This also is a cause of cough. It usually is worse at night when you are lying supine because the drainage rests in the back of the throat. Treatment is generally by antihistamines. Sudafed PE 10 mg every 4 to 6 hours as needed for congestion Allegra or Zyrtec daily as needed for drainage and runny nose. For stronger antihistamine may take Chlor-Trimeton 2 to 4 mg every 4 to 6 hours, may cause drowsiness. Saline nasal spray used frequently. Drink plenty of fluids and stay well-hydrated. Flonase or Rhinocort nasal spray daily    Janne Napoleon, NP 10/31/16 1344

## 2016-10-31 NOTE — ED Triage Notes (Signed)
Pt  Has   persistant  Productive    Cough   X   1  Month      Pt  Denies   Any  Other   Symptoms      Tongue  Is  Coated   White

## 2016-10-31 NOTE — Discharge Instructions (Signed)
You have frequent and persistent sinus drainage in the back of your throat. This is why your clearing her throat so often. This also is a cause of cough. It usually is worse at night when you are lying supine because the drainage rests in the back of the throat. Treatment is generally by antihistamines. Sudafed PE 10 mg every 4 to 6 hours as needed for congestion Allegra or Zyrtec daily as needed for drainage and runny nose. For stronger antihistamine may take Chlor-Trimeton 2 to 4 mg every 4 to 6 hours, may cause drowsiness. Saline nasal spray used frequently. Drink plenty of fluids and stay well-hydrated. Flonase or Rhinocort nasal spray daily

## 2016-11-06 ENCOUNTER — Encounter: Payer: Self-pay | Admitting: Family Medicine

## 2016-11-06 ENCOUNTER — Ambulatory Visit (INDEPENDENT_AMBULATORY_CARE_PROVIDER_SITE_OTHER): Payer: BLUE CROSS/BLUE SHIELD | Admitting: Family Medicine

## 2016-11-06 VITALS — BP 131/85 | HR 73 | Temp 98.4°F | Resp 18 | Ht 63.0 in | Wt 179.6 lb

## 2016-11-06 DIAGNOSIS — E7800 Pure hypercholesterolemia, unspecified: Secondary | ICD-10-CM | POA: Diagnosis not present

## 2016-11-06 DIAGNOSIS — I10 Essential (primary) hypertension: Secondary | ICD-10-CM | POA: Diagnosis not present

## 2016-11-06 DIAGNOSIS — R011 Cardiac murmur, unspecified: Secondary | ICD-10-CM | POA: Diagnosis not present

## 2016-11-06 DIAGNOSIS — R7303 Prediabetes: Secondary | ICD-10-CM | POA: Diagnosis not present

## 2016-11-06 DIAGNOSIS — D72819 Decreased white blood cell count, unspecified: Secondary | ICD-10-CM

## 2016-11-06 DIAGNOSIS — L509 Urticaria, unspecified: Secondary | ICD-10-CM | POA: Diagnosis not present

## 2016-11-06 LAB — POCT SKIN KOH: SKIN KOH, POC: NEGATIVE

## 2016-11-06 MED ORDER — METHYLPREDNISOLONE ACETATE 80 MG/ML IJ SUSP
80.0000 mg | Freq: Once | INTRAMUSCULAR | Status: AC
  Administered 2016-11-06: 80 mg via INTRAMUSCULAR

## 2016-11-06 MED ORDER — RANITIDINE HCL 150 MG PO TABS: 150.0000 mg | ORAL_TABLET | Freq: Two times a day (BID) | ORAL | 0 refills | Status: DC

## 2016-11-06 MED ORDER — CETIRIZINE HCL 10 MG PO TABS: 10.0000 mg | ORAL_TABLET | Freq: Every day | ORAL | 0 refills | Status: DC

## 2016-11-06 MED ORDER — HYDROXYZINE HCL 25 MG PO TABS: 25.0000 mg | ORAL_TABLET | Freq: Four times a day (QID) | ORAL | 0 refills | Status: DC | PRN

## 2016-11-06 NOTE — Progress Notes (Addendum)
Subjective:    Patient ID: Haley Flores, female    DOB: Dec 11, 1965, 51 y.o.   MRN: [id number] Chief Complaint  Patient presents with  . Rash    all over body and itching     HPI  Started itching 2d ago and has quickly spread all over body. Coming and going. Did change detergent last wk - husband bough something. Red spots all over entire body - not as many on back or palms/soles. No ticks. No new foods.  Was seen at [facility] 1 wk ago - diagnosed with pnd causing cough. Used allegra but no relief. Not with a lot of other URI sxs but is having some sinus pressure. N of/c.  No h/o murmur or any cardiology eval. Occ orthostatic sxs, occ swelling in leg, no CP, palps, no SHoB, no DOE but does feel tired all the time.   Does not have a PCP - just follows with her gynecologist Dr. Helane Rima. No new meds - on vaginal estrogen gel and hctz 12.5  Had biometric labs at work mid-May - brought in for my review - has not had anyone look at them to tell her what they mean.  Past Medical History:  Diagnosis Date  . Hypertension    Past Surgical History:  Procedure Laterality Date  . ABDOMINAL HYSTERECTOMY     Current Outpatient Prescriptions on File Prior to Visit  Medication Sig Dispense Refill  . benazepril (LOTENSIN) 20 MG tablet Take 20 mg by mouth daily.    . Diethylpropion HCl (TENUATE PO) Take by mouth once.    . Estradiol (ELESTRIN) 0.52 MG/0.87 GM (0.06%) GEL Apply topically daily.     . hydrochlorothiazide (MICROZIDE) 12.5 MG capsule Take 1 capsule (12.5 mg total) by mouth daily. 30 capsule 1   No current facility-administered medications on file prior to visit.    No Known Allergies Family History  Problem Relation Age of Onset  . Hyperlipidemia Mother   . Hypertension Mother   . Hypertension Father    Social History   Social History  . Marital status: Married    Spouse name: N/A  . Number of children: N/A  . Years of education: N/A   Social History Main Topics  . Smoking  status: Never Smoker  . Smokeless tobacco: Never Used  . Alcohol use 0.0 oz/week     Comment: rarely  . Drug use: No  . Sexual activity: Not Asked   Other Topics Concern  . None   Social History Narrative  . None   Depression screen [facility] 2/9 11/06/2016 08/03/2015  Decreased Interest 0 0  Down, Depressed, Hopeless 0 0  PHQ - 2 Score 0 0     Review of Systems  Constitutional: Positive for fatigue. Negative for appetite change, chills, diaphoresis and fever.  HENT: Positive for postnasal drip and sinus pressure. Negative for congestion, rhinorrhea, sinus pain, sore throat and tinnitus.   Eyes: Negative for visual disturbance.  Respiratory: Positive for cough. Negative for chest tightness, shortness of breath and wheezing.   Cardiovascular: Positive for leg swelling. Negative for chest pain and palpitations.  Genitourinary: Negative for decreased urine volume.  Neurological: Positive for light-headedness. Negative for dizziness, syncope, facial asymmetry and headaches.  Hematological: Does not bruise/bleed easily.   Tongue white    Objective:   Physical Exam  Constitutional: She is oriented to person, place, and time. She appears well-developed and well-nourished. No distress.  HENT:  Head: Normocephalic and atraumatic.  Right Ear:  Tympanic membrane, external ear and ear canal normal.  Left Ear: Tympanic membrane, external ear and ear canal normal.  Nose: Nose normal. No mucosal edema or rhinorrhea.  Mouth/Throat: Uvula is midline, oropharynx is clear and moist and mucous membranes are normal. No oropharyngeal exudate.  Tongue slightly pale  Eyes: Conjunctivae are normal. Right eye exhibits no discharge. Left eye exhibits no discharge. No scleral icterus.  Neck: Normal range of motion. Neck supple.  Cardiovascular: Normal rate, regular rhythm and intact distal pulses.   Murmur (LUSB systolic ejection murmur) heard. Pulmonary/Chest: Effort normal and breath sounds normal.    Lymphadenopathy:    She has no cervical adenopathy.  Neurological: She is alert and oriented to person, place, and time.  Skin: Skin is warm and dry. Rash noted. She is not diaphoretic. No erythema.  Psychiatric: She has a normal mood and affect. Her behavior is normal.       BP 131/85   Pulse 73   Temp 98.4 F (36.9 C) (Oral)   Resp 18   Ht 5\' 3"  (1.6 m)   Wt 179 lb 9.6 oz (81.5 kg)   SpO2 97%   BMI 31.81 kg/m   [facility] reading (PRIMARY) by  Dr. Brigitte Pulse. EKG: NSR, no acute ischemic change, poor R wave progression, none prior for comparison  Biometric labs scanned in - nml cmp, ldh midly elev at 228, nml uric acid, nml thyroid panel, cbc nml other than wbc 3.2, hgba1c 5.9, vit D 25-OH 30.4 Total cholesterol 233 Triglycerides 100 HDL 50 LDL-calc 163 T. Chol/HDL ratio 4.7  Estimated CHD risk 1.2 Assessment & Plan:   1. Full body hives - poss new detergent? Use non-scented/colored prodcuts, start antihistamine and h2 blocker x 3 wks, depometrol 80mg  IM x 1 today. Recheck in 4d  2. Undiagnosed cardiac murmurs - new, encouraged pt to hydrate, recheck later this wk  3. Leukopenia, unspecified type - pt concerned about pale tongue, appears normal to me but will check blood counts - KOH stain on scraping taken from tongue was negative so unlikely to be thrush - watchful waiting. Pt unaware of hx of leukopenia but was 3.2 on work labs done 2 mos ago - diff was nml with low nml ANC. HIV and pathology peripheral smear  4. Prediabetes - a1c 5.9 2 mos prior with biometric labs  5. Pure hypercholesterolemia   6. Essential hypertension - controlled on hctz 12.5 but does not drink any water - will try to start.    Orders Placed This Encounter  Procedures  . CBC with Differential/Platelet  . HIV antibody  . Pathologist smear review  . Pathologist smear review  . POCT Skin KOH  . EKG 12-Lead    Meds ordered this encounter  Medications  . methylPREDNISolone acetate (DEPO-MEDROL) injection  80 mg  . cetirizine (ZYRTEC) 10 MG tablet    Sig: Take 1 tablet (10 mg total) by mouth at bedtime.    Dispense:  30 tablet    Refill:  0  . ranitidine (ZANTAC) 150 MG tablet    Sig: Take 1 tablet (150 mg total) by mouth 2 (two) times daily.    Dispense:  60 tablet    Refill:  0  . hydrOXYzine (ATARAX/VISTARIL) 25 MG tablet    Sig: Take 1 tablet (25 mg total) by mouth every 6 (six) hours as needed for itching.    Dispense:  30 tablet    Refill:  0     Delman Cheadle, M.D.  Primary  Care at [facility] [address] [place], [place] [address] [phone] phone [phone] fax  11/08/16 8:48 PM

## 2016-11-06 NOTE — Patient Instructions (Addendum)
Use ice when you are itching. Drink at least 64 ounces of water daily until our recheck.    IF you received an x-ray today, you will receive an invoice from [facility]. Please contact [facility] at [phone] with questions or concerns regarding your invoice.   IF you received labwork today, you will receive an invoice from [facility]. Please contact [facility] at [phone] with questions or concerns regarding your invoice.   Our billing staff will not be able to assist you with questions regarding bills from these companies.  You will be contacted with the lab results as soon as they are available. The fastest way to get your results is to activate your My Chart account. Instructions are located on the last page of this paperwork. If you have not heard from [place] regarding the results in 2 weeks, please contact this office.       Hives Hives (urticaria) are itchy, red, swollen areas on your skin. Hives can appear on any part of your body and can vary in size. They can be as small as the tip of a pen or much larger. Hives often fade within 24 hours (acute hives). In other cases, new hives appear after old ones fade. This cycle can continue for several days or weeks (chronic hives). Hives result from your body's reaction to an irritant or to something that you are allergic to (trigger). When you are exposed to a trigger, your body releases a chemical (histamine) that causes redness, itching, and swelling. You can get hives immediately after being exposed to a trigger or hours later. Hives do not spread from person to person (are not contagious). Your hives may get worse with scratching, exercise, and emotional stress. What are the causes? Causes of this condition include:  Allergies to certain foods or ingredients.  Insect bites or stings.  Exposure to pollen or pet dander.  Contact with latex or chemicals.  Spending time in sunlight, heat, or cold  (exposure).  Exercise.  Stress.  You can also get hives from some medical conditions and treatments. These include:  Viruses, including the common cold.  Bacterial infections, such as urinary tract infections and strep throat.  Disorders such as vasculitis, lupus, or thyroid disease.  Certain medications.  Allergy shots.  Blood transfusions.  Sometimes, the cause of hives is not known (idiopathic hives). What increases the risk? This condition is more likely to develop in:  Women.  People who have food allergies, especially to citrus fruits, milk, eggs, peanuts, tree nuts, or shellfish.  People who are allergic to: ? Medicines. ? Latex. ? Insects. ? Animals. ? Pollen.  People who have certain medical conditions, includinglupus or thyroid disease.  What are the signs or symptoms? The main symptom of this condition is raised, itchyred or white bumps or patches on your skin. These areas may:  Become large and swollen (welts).  Change in shape and location, quickly and repeatedly.  Be separate hives or connect over a large area of skin.  Sting or become painful.  Turn white when pressed in the center (blanch).  In severe cases, yourhands, feet, and face may also become swollen. This may occur if hives develop deeper in your skin. How is this diagnosed? This condition is diagnosed based on your symptoms, medical history, and physical exam. Your skin, urine, or blood may be tested to find out what is causing your hives and to rule out other health issues. Your health care provider may also remove a small  sample of skin from the affected area and examine it under a microscope (biopsy). How is this treated? Treatment depends on the severity of your condition. Your health care provider may recommend using cool, wet cloths (cool compresses) or taking cool showers to relieve itching. Hives are sometimes treated with medicines,  including:  Antihistamines.  Corticosteroids.  Antibiotics.  An injectable medicine (omalizumab). Your health care provider may prescribe this if you have chronic idiopathic hives and you continue to have symptoms even after treatment with antihistamines.  Severe cases may require an emergency injection of adrenaline (epinephrine) to prevent a life-threatening allergic reaction (anaphylaxis). Follow these instructions at home: Medicines  Take or apply over-the-counter and prescription medicines only as told by your health care provider.  If you were prescribed an antibiotic medicine, use it as told by your health care provider. Do not stop taking the antibiotic even if you start to feel better. Skin Care  Apply cool compresses to the affected areas.  Do not scratch or rub your skin. General instructions  Do not take hot showers or baths. This can make itching worse.  Do not wear tight-fitting clothing.  Use sunscreen and wear protective clothing when you are outside.  Avoid any substances that cause your hives. Keep a journal to help you track what causes your hives. Write down: ? What medicines you take. ? What you eat and drink. ? What products you use on your skin.  Keep all follow-up visits as told by your health care provider. This is important. Contact a health care provider if:  Your symptoms are not controlled with medicine.  Your joints are painful or swollen. Get help right away if:  You have a fever.  You have pain in your abdomen.  Your tongue or lips are swollen.  Your eyelids are swollen.  Your chest or throat feels tight.  You have trouble breathing or swallowing. These symptoms may represent a serious problem that is an emergency. Do not wait to see if the symptoms will go away. Get medical help right away. Call your local emergency services (911 in the [place] Do not drive yourself to the hospital. This information is not intended to replace  advice given to you by your health care provider. Make sure you discuss any questions you have with your health care provider. Document Released: 04/17/2005 Document Revised: 09/15/2015 Document Reviewed: 02/03/2015 Elsevier Interactive Patient Education  2017 [place] A heart murmur is an extra sound that is caused by chaotic blood flow. The murmur can be heard as a "hum" or "whoosh" sound when blood flows through the heart. The heart has four areas called chambers. Valves separate the upper and lower chambers from each other (tricuspid valve and mitral valve) and separate the lower chambers of the heart from pathways that lead away from the heart (aortic valve and pulmonary valve). Normally, the valves open to let blood flow through or out of your heart, and then they shut to keep the blood from flowing backward. There are two types of heart murmurs:  Innocent murmurs. Most people with this type of heart murmur do not have a heart problem. Many children have innocent heart murmurs. Your health care provider may suggest some basic testing to find out whether your murmur is an innocent murmur. If an innocent heart murmur is found, there is no need for further tests or treatment and no need to restrict activities or stop playing sports.  Abnormal murmurs. These types  of murmurs can occur in children and adults. Abnormal murmurs may be a sign of a more serious heart condition, such as a heart defect present at birth (congenital defect) or heart valve disease.  What are the causes? This condition is caused by heart valves that are not working properly. In children, abnormal heart murmurs are typically caused by congenital defects. In adults, abnormal murmurs are usually from heart valve problems caused by disease, infection, or aging. Three types of heart valve defects can cause a murmur:  Regurgitation. This is when blood leaks back through the valve in the wrong  direction.  Mitral valve prolapse. This is when the mitral valve of the heart has a loose flap and does not close tightly.  Stenosis. This is when a valve does not open enough and blocks blood flow.  This condition may also be caused by:  Pregnancy.  Fever.  Overactive thyroid gland.  Anemia.  Exercise.  Rapid growth spurts (in children).  What are the signs or symptoms? Innocent murmurs do not cause symptoms, and many people with abnormal murmurs may or may not have symptoms. If symptoms do develop, they may include:  Shortness of breath.  Blue coloring of the skin, especially on the fingertips.  Chest pain.  Palpitations, or feeling a fluttering or skipped heartbeat.  Fainting.  Persistent cough.  Getting tired much faster than expected.  Swelling in the abdomen, feet, or ankles.  How is this diagnosed? This condition may be diagnosed during a routine physical or other exam. If your health care provider hears a murmur with a stethoscope, he or she will listen for:  Where the murmur is located in your heart.  How long the murmur lasts (duration).  When the murmur is heard during the heartbeat.  How loud the murmur is. This may help the health care provider figure out what is causing the murmur.  You may be referred to a heart specialist (cardiologist). You may also have other tests, including:  Electrocardiogram (ECG or EKG). This test measures the electrical activity of your heart.  Echocardiogram. This test uses high frequency sound waves to make pictures of your heart.  MRI or chest X-ray.  Cardiac catheterization. This test looks at blood flow through the heart.  For children and adults who have an abnormal heart murmur and want to stay active, it is important to complete testing, review test results, and receive recommendations from your health care provider. If heart disease is present, it may not be safe to play or be active. How is this  treated? Heart murmurs themselves do not need treatment. In some cases, a heart murmur may go away on its own. If an underlying problem or disease is causing the murmur, you may need treatment. If treatment is needed, it will depend on the type and severity of the disease or heart problem causing the murmur. Treatment may include:  Medicine.  Surgery.  Dietary and lifestyle changes.  Follow these instructions at home:  Talk with your health care provider before participating in sports or other activities that require a lot of effort and energy (are strenuous).  Learn as much as possible about your condition and any related diseases. Ask your health care provider if you may at risk for any medical emergencies.  Talk with your health care provider about what symptoms you should look out for.  It is up to you to get your test results. Ask your health care provider, or the department that is doing  the test, when your results will be ready.  Keep all follow-up visits as told by your health care provider. This is important. Contact a health care provider if:  You feel light-headed.  You are frequently short of breath.  You feel more tired than usual.  You are having a hard time keeping up with normal activities or fitness routines.  You have swelling in your ankles or feet.  You have chest pain.  You notice that your heart often beats irregularly.  You develop any new symptoms. Get help right away if:  You develop severe chest pain.  You are having trouble breathing.  You have fainting spells.  Your symptoms suddenly get worse. These symptoms may represent a serious problem that is an emergency. Do not wait to see if the symptoms will go away. Get medical help right away. Call your local emergency services (911 in the [place] Do not drive yourself to the hospital. Summary  Normally, the heart valves open to let blood flow through or out of your heart, and then they shut to  keep the blood from flowing backward.  Heart murmur is caused by heart valves that are not working properly.  You may need treatment if an underlying problem or disease is causing the heart murmur. Treatment may include medicine, surgery, or dietary and lifestyle changes.  Talk with your health care provider before participating in sports or other activities that require a lot of effort and energy (are strenuous).  Talk with your health care provider about what symptoms you should watch out for. This information is not intended to replace advice given to you by your health care provider. Make sure you discuss any questions you have with your health care provider. Document Released: 05/25/2004 Document Revised: 04/05/2016 Document Reviewed: 04/05/2016 Elsevier Interactive Patient Education  2017 [employer].

## 2016-11-08 DIAGNOSIS — I10 Essential (primary) hypertension: Secondary | ICD-10-CM | POA: Insufficient documentation

## 2016-11-08 DIAGNOSIS — E7800 Pure hypercholesterolemia, unspecified: Secondary | ICD-10-CM | POA: Insufficient documentation

## 2016-11-08 DIAGNOSIS — R7303 Prediabetes: Secondary | ICD-10-CM | POA: Insufficient documentation

## 2016-11-10 ENCOUNTER — Encounter: Payer: Self-pay | Admitting: Family Medicine

## 2016-11-10 ENCOUNTER — Ambulatory Visit (INDEPENDENT_AMBULATORY_CARE_PROVIDER_SITE_OTHER): Payer: BLUE CROSS/BLUE SHIELD | Admitting: Family Medicine

## 2016-11-10 VITALS — BP 138/92 | HR 77 | Temp 98.3°F | Resp 18 | Ht 63.0 in | Wt 179.6 lb

## 2016-11-10 DIAGNOSIS — I10 Essential (primary) hypertension: Secondary | ICD-10-CM | POA: Diagnosis not present

## 2016-11-10 DIAGNOSIS — L509 Urticaria, unspecified: Secondary | ICD-10-CM

## 2016-11-10 DIAGNOSIS — E6609 Other obesity due to excess calories: Secondary | ICD-10-CM | POA: Diagnosis not present

## 2016-11-10 DIAGNOSIS — Z6831 Body mass index (BMI) 31.0-31.9, adult: Secondary | ICD-10-CM | POA: Diagnosis not present

## 2016-11-10 DIAGNOSIS — R011 Cardiac murmur, unspecified: Secondary | ICD-10-CM

## 2016-11-10 LAB — PATHOLOGIST SMEAR REVIEW
Basophils Absolute: 0.1 10*3/uL (ref 0.0–0.2)
Basos: 2 %
EOS (ABSOLUTE): 0.1 10*3/uL (ref 0.0–0.4)
Eos: 2 %
HEMOGLOBIN: 12.9 g/dL (ref 11.1–15.9)
Hematocrit: 37.4 % (ref 34.0–46.6)
IMMATURE GRANS (ABS): 0 10*3/uL (ref 0.0–0.1)
Immature Granulocytes: 0 %
LYMPHS ABS: 1.2 10*3/uL (ref 0.7–3.1)
Lymphs: 34 %
MCH: 31.5 pg (ref 26.6–33.0)
MCHC: 34.5 g/dL (ref 31.5–35.7)
MCV: 91 fL (ref 79–97)
Monocytes Absolute: 0.2 10*3/uL (ref 0.1–0.9)
Monocytes: 7 %
NEUTROS PCT: 55 %
Neutrophils Absolute: 1.9 10*3/uL (ref 1.4–7.0)
PATH REV PLTS: NORMAL
Path Rev RBC: NORMAL
Path Rev WBC: NORMAL
Platelets: 370 10*3/uL (ref 150–379)
RBC: 4.09 x10E6/uL (ref 3.77–5.28)
RDW: 14.2 % (ref 12.3–15.4)
WBC: 3.4 10*3/uL (ref 3.4–10.8)

## 2016-11-10 LAB — HIV ANTIBODY (ROUTINE TESTING W REFLEX): HIV Screen 4th Generation wRfx: NONREACTIVE

## 2016-11-10 MED ORDER — METHYLPREDNISOLONE ACETATE 80 MG/ML IJ SUSP
80.0000 mg | Freq: Once | INTRAMUSCULAR | Status: AC
Start: 2016-11-10 — End: 2016-11-10
  Administered 2016-11-10: 80 mg via INTRAMUSCULAR

## 2016-11-10 NOTE — Patient Instructions (Signed)
     IF you received an x-ray today, you will receive an invoice from [facility]. Please contact [facility] at [phone] with questions or concerns regarding your invoice.   IF you received labwork today, you will receive an invoice from [facility]. Please contact [facility] at [phone] with questions or concerns regarding your invoice.   Our billing staff will not be able to assist you with questions regarding bills from these companies.  You will be contacted with the lab results as soon as they are available. The fastest way to get your results is to activate your My Chart account. Instructions are located on the last page of this paperwork. If you have not heard from us regarding the results in 2 weeks, please contact this office.     

## 2016-11-10 NOTE — Progress Notes (Addendum)
Subjective:  By signing my name below, I, Haley Flores, attest that this documentation has been prepared under the direction and in the presence of Haley Cheadle, MD. Electronically Signed: Moises Flores, Leland. 11/10/2016 , 6:10 PM .  Patient was seen in Room 10 .   Patient ID: Haley Flores, female    DOB: 03-14-1966, 51 y.o.   MRN: [id number] Chief Complaint  Patient presents with  . Heart Murmur  . Labs  . Follow-up   HPI [name] is a 51 y.o. female who presents to Primary Care at [facility] for follow up.   She was last seen 4 days ago for rash appearing all over her body that started 2 days prior. She locates her rash appearing all over entire body, except sparing back, or palm/soles. She received depometrol injection in office, and was prescribed zantac and hydroxyzine. She states her rash has improved since. She denies history of smoking. She denies family history of lung disease.   At last visit, she had a new cardiac murmur. Recheck today.   Past Medical History:  Diagnosis Date  . Hypertension    Prior to Admission medications   Medication Sig Start Date End Date Taking? Authorizing Provider  benazepril (LOTENSIN) 20 MG tablet Take 20 mg by mouth daily.    [provider]  cetirizine (ZYRTEC) 10 MG tablet Take 1 tablet (10 mg total) by mouth at bedtime. 11/06/16   Haley Knapp, MD  Diethylpropion HCl (TENUATE PO) Take by mouth once.    [provider]  Estradiol (ELESTRIN) 0.52 MG/0.87 GM (0.06%) GEL Apply topically daily.     [provider]  hydrochlorothiazide (MICROZIDE) 12.5 MG capsule Take 1 capsule (12.5 mg total) by mouth daily. 07/28/14   Wendie Agreste, MD  hydrOXYzine (ATARAX/VISTARIL) 25 MG tablet Take 1 tablet (25 mg total) by mouth every 6 (six) hours as needed for itching. 11/06/16   Haley Knapp, MD  ranitidine (ZANTAC) 150 MG tablet Take 1 tablet (150 mg total) by mouth 2 (two) times daily. 11/06/16   Haley Knapp, MD   No Known  Allergies  Review of Systems  Constitutional: Negative for fatigue and unexpected weight change.  Respiratory: Negative for chest tightness and shortness of breath.   Cardiovascular: Negative for chest pain, palpitations and leg swelling.  Gastrointestinal: Negative for abdominal pain and Flores in stool.  Skin: Positive for rash.  Neurological: Negative for dizziness, syncope, light-headedness and headaches.       Objective:   Physical Exam  Constitutional: She is oriented to person, place, and time. She appears well-developed and well-nourished. No distress.  HENT:  Head: Normocephalic and atraumatic.  Eyes: Pupils are equal, round, and reactive to light. EOM are normal.  Neck: Neck supple.  Cardiovascular: Normal rate.   Murmur (systolic ejection murmur) heard.  Systolic (left upper sternal border) murmur is present  Pulmonary/Chest: Effort normal and breath sounds normal. No respiratory distress. She has no wheezes.  Musculoskeletal: Normal range of motion.  Neurological: She is alert and oriented to person, place, and time.  Skin: Skin is warm and dry.  Urticarial 1-2cm round rash over bilateral upper arms, excoriated lesions over distal arms and legs  Psychiatric: She has a normal mood and affect. Her behavior is normal.  Nursing note and vitals reviewed.   BP (!) 138/92 (BP Location: Right Arm, Patient Position: Sitting, Cuff Size: Normal)   Pulse 77   Temp 98.3 F (36.8 C) (Oral)  Resp 18   Ht 5\' 3"  (1.6 m)   Wt 179 lb 9.6 oz (81.5 kg)   SpO2 100%   BMI 31.81 kg/m   [18:07] BP recheck in room, 122/86 (left arm, sitting).     Assessment & Plan:   1. Full body hives - improving but feels more pruritic today and can't tolerate the fatigue from the hydroxyzine so repeat the depomedrol today. Cont zantac and zyrtec x 2-3 wks.  2. Cardiac murmur - asymptomatic and bp well controlled. Will send for echo since newly diagnosed  3. Essential hypertension   4. Class 1  obesity due to excess calories without serious comorbidity with body mass index (BMI) of 31.0 to 31.9 in adult     Orders Placed This Encounter  Procedures  . ECHOCARDIOGRAM COMPLETE    Standing Status:   Future    Standing Expiration Date:   02/10/2018    Order Specific Question:   Where should this test be performed    Answer:   [facility])    Order Specific Question:   Does the patient weigh less than or greater than 250 lbs?    Answer:   Patient weighs less than 250 lbs    Order Specific Question:   Perflutren DEFINITY (image enhancing agent) should be administered unless hypersensitivity or allergy exist    Answer:   Administer Perflutren    Order Specific Question:   Expected Date:    Answer:   1 month    Meds ordered this encounter  Medications  . methylPREDNISolone acetate (DEPO-MEDROL) injection 80 mg    I personally performed the services described in this documentation, which was scribed in my presence. The recorded information has been reviewed and considered, and addended by me as needed.   Haley Flores, M.D.  Primary Care at [facility] [address] [place], [place] [address] [phone] phone [phone] fax  11/13/16 2:49 PM

## 2016-12-03 ENCOUNTER — Other Ambulatory Visit: Payer: Self-pay | Admitting: Family Medicine

## 2016-12-08 ENCOUNTER — Ambulatory Visit (HOSPITAL_COMMUNITY): Payer: BLUE CROSS/BLUE SHIELD | Attending: Internal Medicine

## 2016-12-08 ENCOUNTER — Other Ambulatory Visit: Payer: Self-pay

## 2016-12-08 DIAGNOSIS — I361 Nonrheumatic tricuspid (valve) insufficiency: Secondary | ICD-10-CM | POA: Insufficient documentation

## 2016-12-08 DIAGNOSIS — R011 Cardiac murmur, unspecified: Secondary | ICD-10-CM | POA: Insufficient documentation

## 2016-12-20 ENCOUNTER — Telehealth: Payer: Self-pay | Admitting: Family Medicine

## 2016-12-20 NOTE — Telephone Encounter (Signed)
PATIENT WOULD LIKE DR. SHAW TO KNOW THAT SHE HAD HER ECHOCARDIOGRAM DONE ON AUGUST 10th. SHE WOULD LIKE TO GET THE RESULTS PLEASE. BEST PHONE [phone] (CELL) PHARMACY CHOICE IF NEEDED IS [facility] ON [address]. [place]

## 2016-12-21 NOTE — Telephone Encounter (Signed)
Sent to the cma/rad pool.

## 2016-12-22 ENCOUNTER — Telehealth: Payer: Self-pay | Admitting: Family Medicine

## 2016-12-22 NOTE — Telephone Encounter (Signed)
-----   Message from Shawnee Knapp, MD sent at 12/21/2016  7:18 PM EDT ----- Please let Haley Flores know that the echocardiogram overall looked really good but there were a few abnormalities that may explain her fatigue as well as the murmur.  Her heart is a little stiffer than normal which is leading to a mild enlargement of the upper part of the heart.  She also has high blood pressure in her lungs which is causing one of the valves to not close fully when the heart is pumping and therefore results in the heart murmur. High blood pressure in the lungs can be associated with fatigue but other conditions which cause fatigue can also result in high blood pressure in the lungs. Therefore I would like her to come back in for a visit so we can do a chest x-ray and talk in more detail about her history, family history, and current symptoms in hopes of finding what direction we should proceed to determine the cause of this, so that then we can have a chance to treat it before it causes any permanent damage.  Please schedule a follow-up appointment with me to discuss.  If appointment is at 104, needs to go to 102 for chest x-ray prior to visit.

## 2016-12-22 NOTE — Telephone Encounter (Signed)
Once clinical calls patient about results please transfer call to extension [phone] so that clerical can make her an appointment.  Thank you

## 2016-12-29 NOTE — Telephone Encounter (Signed)
LMVM to CB results and to schedule appt.

## 2016-12-29 NOTE — Telephone Encounter (Signed)
Patient called back.  I gave her Dr. Raul Del instructions verbatim, however, she did not seem to understand how her echo could be overall good when there was so much about the results that seemed like negatives to her.  I tried to explain that these were minor things.  She was very concerned about having a heart murmur, however, I explained to her that a murmur is just a swishing sound and it was not anything for her to worry about.  She seemed more assured at that time.  I sent her to [facility] to schedule an appt with Dr. Brigitte Pulse.  Her appt is tomorrow.

## 2016-12-30 ENCOUNTER — Encounter: Payer: Self-pay | Admitting: Family Medicine

## 2016-12-30 ENCOUNTER — Ambulatory Visit (INDEPENDENT_AMBULATORY_CARE_PROVIDER_SITE_OTHER): Payer: BLUE CROSS/BLUE SHIELD

## 2016-12-30 ENCOUNTER — Ambulatory Visit (INDEPENDENT_AMBULATORY_CARE_PROVIDER_SITE_OTHER): Payer: BLUE CROSS/BLUE SHIELD | Admitting: Family Medicine

## 2016-12-30 VITALS — BP 119/81 | HR 82 | Temp 99.1°F | Resp 16 | Ht 63.0 in | Wt 176.0 lb

## 2016-12-30 DIAGNOSIS — E6609 Other obesity due to excess calories: Secondary | ICD-10-CM | POA: Diagnosis not present

## 2016-12-30 DIAGNOSIS — I272 Pulmonary hypertension, unspecified: Secondary | ICD-10-CM

## 2016-12-30 DIAGNOSIS — Z8639 Personal history of other endocrine, nutritional and metabolic disease: Secondary | ICD-10-CM

## 2016-12-30 DIAGNOSIS — I10 Essential (primary) hypertension: Secondary | ICD-10-CM

## 2016-12-30 DIAGNOSIS — E559 Vitamin D deficiency, unspecified: Secondary | ICD-10-CM | POA: Diagnosis not present

## 2016-12-30 DIAGNOSIS — R7303 Prediabetes: Secondary | ICD-10-CM | POA: Diagnosis not present

## 2016-12-30 DIAGNOSIS — R0989 Other specified symptoms and signs involving the circulatory and respiratory systems: Secondary | ICD-10-CM

## 2016-12-30 DIAGNOSIS — R011 Cardiac murmur, unspecified: Secondary | ICD-10-CM

## 2016-12-30 DIAGNOSIS — Z6831 Body mass index (BMI) 31.0-31.9, adult: Secondary | ICD-10-CM

## 2016-12-30 DIAGNOSIS — E7800 Pure hypercholesterolemia, unspecified: Secondary | ICD-10-CM

## 2016-12-30 DIAGNOSIS — R5382 Chronic fatigue, unspecified: Secondary | ICD-10-CM

## 2016-12-30 MED ORDER — ALBUTEROL SULFATE (2.5 MG/3ML) 0.083% IN NEBU
2.5000 mg | INHALATION_SOLUTION | Freq: Once | RESPIRATORY_TRACT | Status: AC
  Administered 2016-12-30: 2.5 mg via RESPIRATORY_TRACT

## 2016-12-30 NOTE — Patient Instructions (Addendum)
IF you received an x-ray today, you will receive an invoice from [facility]. Please contact [facility] at [phone] with questions or concerns regarding your invoice.   IF you received labwork today, you will receive an invoice from [facility]. Please contact [facility] at [phone] with questions or concerns regarding your invoice.   Our billing staff will not be able to assist you with questions regarding bills from these companies.  You will be contacted with the lab results as soon as they are available. The fastest way to get your results is to activate your My Chart account. Instructions are located on the last page of this paperwork. If you have not heard from [place] regarding the results in 2 weeks, please contact this office.    We recommend that you schedule a mammogram for breast cancer screening. Typically, you do not need a referral to do this. Please contact a local imaging center to schedule your mammogram.  [facility] - [phone]  *ask for the Radiology Department [facility] ([place]) - [phone] or [phone]  [facility] - [phone] [place] [phone] [facility] - [phone]  *ask for the [facility] - [phone]  *ask for the Radiology Department [facility] - [phone]  *ask for the Mammography Department [facility] - [phone]   Pulmonary Hypertension Pulmonary hypertension is high blood pressure within the arteries in your lungs (pulmonary arteries). It is different than having high blood pressure elsewhere in your body, such as blood pressure that is measured with a blood pressure cuff. Pulmonary hypertension makes it harder for blood to flow through the lungs. As a result, the heart must work harder to pump blood through the lungs, and it may be harder for you to breathe. Over time, this  can weaken the heart muscle. Pulmonary hypertension is a serious condition and it can be fatal. What are the causes? Many different medical conditions can cause pulmonary hypertension. Pulmonary hypertension can be categorized by cause into five groups: Group 1 Pulmonary hypertension that is caused by abnormal growth of small blood vessels in the lungs (pulmonary arterial hypertension). The abnormal blood vessel growth may have no known cause, or it may be:  Passed along from a parent (hereditary).  Caused by another disease, such as a connective tissue disease (including lupus or scleroderma) or HIV.  Caused by certain drugs or toxins.  Group 2 Pulmonary hypertension that is caused by weakness of the main chamber of the heart (left ventricle) or heart valve disease. Group 3 Pulmonary hypertension that is caused by lung disease or low oxygen levels. Causes in this group include:  Emphysema or chronic obstructive pulmonary disease (COPD).  Untreated sleep apnea.  Pulmonary fibrosis.  Group 4 Pulmonary hypertension that is caused by blood clots in the lungs (pulmonary emboli). Group 5 Other causes of pulmonary hypertension, such as sickle cell anemia, or a mix of multiple causes. What are the signs or symptoms? Symptoms of this condition include:  Shortness of breath. You may notice shortness of breath with: ? Activity, such as walking. ? No activity.  Tiredness and fatigue.  Dizziness or fainting.  Rapid heartbeat or feeling your heart flutter or skip a beat (palpitations).  Neck vein enlargement.  Bluish color to your lips and fingertips.  How is this diagnosed? This condition may be diagnosed by:  Chest X-ray.  Arterial blood gases. This test  checks the acidity of your blood as well as your blood oxygen and carbon dioxide levels.  CT scan. This test can provide detailed images of your lungs.  Pulmonary function test. This test measures how much air your lungs can  hold. It also tests how well air moves in and out of your lungs.  Electrocardiogram (ECG). This test traces the electrical activity of your heart.  Echocardiogram. This test is used to look at your heart in motion and check how it is functioning.  Heart catheterization. This test can measure the pressure in your pulmonary artery and the right side of your heart.  Lung biopsy. This procedure involves checking a sample of lung tissue to find underlying causes.  How is this treated? There is no cure for pulmonary hypertension, but treatment can help to relieve symptoms and slow the progress of the condition. Treatment can involve:  Medicines, such as: ? Blood pressure medicines. ? Medicines to relax (dilate) the pulmonary blood vessels. ? Water pills to get rid of extra fluid (diuretic medicines). ? Blood-thinning medicines.  Surgery. For severe pulmonary hypertension that does not respond to medical treatment, heart-lung or lung transplant may be needed.  Follow these instructions at home:  Take medicines only as directed by your health care provider. These include over-the-counter medicines and prescription medicines. Take all medicines exactly as instructed. Do not change or stop medicines without first checking with your health care provider.  Do not smoke. If you need help quitting, ask your health care provider.  Eat a healthy diet.  Limit your salt (sodium) intake to less than 2,300 mg per day.  Stay as active as possible. Exercise as directed by your health care provider. Talk with your health care provider about what type of exercise is safe for you.  Avoid high altitudes.  Avoid hot tubs and saunas.  Avoid becoming pregnant, if this applies. Talk with your health care provider about safe methods of birth control.  Keep all follow-up visits as directed by your health care provider. This is important. Get help right away if:  You have severe shortness of breath.  You  develop chest pain or pressure in your chest.  You cough up blood.  You develop swelling of your feet or legs.  You have a significant increase in weight within 1-2 days. This information is not intended to replace advice given to you by your health care provider. Make sure you discuss any questions you have with your health care provider. Document Released: 02/12/2007 Document Revised: 11/05/2015 Document Reviewed: 10/07/2012 Elsevier Interactive Patient Education  2018 [employer].

## 2016-12-30 NOTE — Progress Notes (Signed)
Subjective:    Patient ID: Haley Flores, female    DOB: [birth year], 51 y.o.   MRN: [id number] Chief Complaint  Patient presents with  . Follow-up    Results    HPI  Hse wakes up after 3 hours - always wakes up after a few hours. No trouble falling asleep. Never gets more than 3 hrs at a time and a total of 6 hours for many years. Tries not to urinate overnight. Occ dreams. Feels lfine when she wakes up in the morning for 3-4 days. Does not nap as she is to busy. Does drift off to sleep when sitting/waiting/watching tv.  Not if driving or in church - if these is anyting going on, she will not fall asleep. Does snore, more so when she is tired - no reports of apnea.  No grinding teeth/jaw pain but does keep chronic sinus congestions/drainage.  No swelling inner leg, occ h/o anemia.  No DOE.  Was exposed to second hand smoke while growing up but no h/o lung disease in the family.    Has been anemic in the past.  Did have thyroid checked w/ biometric labs that were normal. Past Medical History:  Diagnosis Date  . Hypertension    Past Surgical History:  Procedure Laterality Date  . ABDOMINAL HYSTERECTOMY     Current Outpatient Prescriptions on File Prior to Visit  Medication Sig Dispense Refill  . benazepril (LOTENSIN) 20 MG tablet Take 20 mg by mouth daily.    . cetirizine (ZYRTEC) 10 MG tablet Take 1 tablet (10 mg total) by mouth at bedtime. 30 tablet 0  . hydrochlorothiazide (MICROZIDE) 12.5 MG capsule Take 1 capsule (12.5 mg total) by mouth daily. 30 capsule 1   No current facility-administered medications on file prior to visit.    No Known Allergies Family History  Problem Relation Age of Onset  . Hyperlipidemia Mother   . Hypertension Mother   . Hypertension Father    Social History   Social History  . Marital status: Married    Spouse name: N/A  . Number of children: N/A  . Years of education: N/A   Social History Main Topics  . Smoking status: Never Smoker  .  Smokeless tobacco: Never Used  . Alcohol use 0.0 oz/week     Comment: rarely  . Drug use: No  . Sexual activity: Not Asked   Other Topics Concern  . None   Social History Narrative  . None   Depression screen [facility] 2/9 11/10/2016 11/06/2016 08/03/2015  Decreased Interest 0 0 0  Down, Depressed, Hopeless 0 0 0  PHQ - 2 Score 0 0 0    Review of Systems  Constitutional: Positive for fatigue. Negative for activity change, appetite change, chills, diaphoresis, fever and unexpected weight change.  Respiratory: Negative for apnea, cough, chest tightness, shortness of breath and wheezing.   Cardiovascular: Negative for chest pain, palpitations and leg swelling.  Endocrine: Negative for polydipsia, polyphagia and polyuria.  Genitourinary: Negative for enuresis, frequency and urgency.  Allergic/Immunologic: Negative for immunocompromised state.  Neurological: Negative for dizziness, syncope, weakness, light-headedness and numbness.  Psychiatric/Behavioral: Positive for sleep disturbance.       Objective:   Physical Exam  Constitutional: She is oriented to person, place, and time. She appears well-developed and well-nourished. No distress.  HENT:  Head: Normocephalic and atraumatic.  Right Ear: External ear normal.  Left Ear: External ear normal.  Eyes: Conjunctivae are normal. No scleral icterus.  Neck: Normal  range of motion. Neck supple. No thyromegaly present.  Cardiovascular: Normal rate, regular rhythm, normal heart sounds and intact distal pulses.   Pulmonary/Chest: Effort normal and breath sounds normal. No respiratory distress.  Musculoskeletal: She exhibits no edema.  Lymphadenopathy:    She has no cervical adenopathy.  Neurological: She is alert and oriented to person, place, and time.  Skin: Skin is warm and dry. She is not diaphoretic. No erythema.  Psychiatric: She has a normal mood and affect. Her behavior is normal.     Predicted peak flow: 457; actual peak flow 352  L/min pre-neb; 375 post-neb  BP 119/81   Pulse 82   Temp 99.1 F (37.3 C) (Oral)   Resp 16   Ht 5\' 3"  (1.6 m)   Wt 176 lb (79.8 kg)   SpO2 98%   BMI 31.18 kg/m .  Assessment & Plan:  Need colonoscopy Fatigue, non-restful sleep. Vit D -   1. Pulmonary hypertension (HCC) - pulm art pressures borderline elev at 35 mmHg on recent echo obtained to w/u new heart murmur. No other sig abnml seen. No known cause for elev pulm pressure - no h/o lung disease with nml CXR, does need sleep study to r/o OSA. midly obese. HTN well-controlled. Will refer to cardiology to see if pt needs any further eval - I hope that this is borderline enough that it is not significantly important but would like specialist input as to this.  2. Essential hypertension - well controlled on hctz 25 and benazepril rx'ed by her gynecologist  3. Prediabetes - borderline Lab Results  Component Value Date   HGBA1C 5.7 (H) 12/30/2016    4. Pure hypercholesterolemia   5. Cardiac murmur - not there today  6. H/O iron deficiency   7. Vitamin D deficiency   8. Chronic fatigue - does endorse sig daytime fatigue (though low epworth sleepiness scale) and non-restful freq interrupted sleep of unknown etiology so needs sleep study to r/u OSA.   9. Abnormally low peak expiratory flow rate - peak flow was 100 below predicted - given neb and repeated - with only 25 L/min improvement. - ?deconditioning vs poor training for test.    Orders Placed This Encounter  Procedures  . DG Chest 2 View    Standing Status:   Future    Number of Occurrences:   1    Standing Expiration Date:   12/30/2017    Order Specific Question:   Reason for Exam (SYMPTOM  OR DIAGNOSIS REQUIRED)    Answer:   elevated pulmonary pressure on echo    Order Specific Question:   Is the patient pregnant?    Answer:   No    Order Specific Question:   Preferred imaging location?    Answer:   External  . VITAMIN D 25 Hydroxy (Vit-D Deficiency, Fractures)  .  Ferritin  . Hemoglobin A1c  . TSH  . Ambulatory referral to Cardiology    Referral Priority:   Routine    Referral Type:   Consultation    Referral Reason:   Specialty Services Required    Requested Specialty:   Cardiology    Number of Visits Requested:   1  . Ambulatory referral to Sleep Studies    Referral Priority:   Routine    Referral Type:   Consultation    Referral Reason:   Specialty Services Required    Number of Visits Requested:   1    Meds ordered this encounter  Medications  .  Estradiol (DIVIGEL) 0.25 MG/0.25GM GEL    Sig: Place onto the skin daily.  Marland Kitchen albuterol (PROVENTIL) (2.5 MG/3ML) 0.083% nebulizer solution 2.5 mg    Delman Cheadle, M.D.  Primary Care at [facility] [address] [place], [place] [address] [phone] phone [phone] fax  12/31/16 3:29 PM

## 2016-12-31 DIAGNOSIS — E559 Vitamin D deficiency, unspecified: Secondary | ICD-10-CM | POA: Insufficient documentation

## 2017-01-02 LAB — TSH: TSH: 2.16 u[IU]/mL (ref 0.450–4.500)

## 2017-01-02 LAB — HEMOGLOBIN A1C
Est. average glucose Bld gHb Est-mCnc: 117 mg/dL
HEMOGLOBIN A1C: 5.7 % — AB (ref 4.8–5.6)

## 2017-01-02 LAB — FERRITIN: Ferritin: 80 ng/mL (ref 15–150)

## 2017-01-02 LAB — VITAMIN D 25 HYDROXY (VIT D DEFICIENCY, FRACTURES): VIT D 25 HYDROXY: 33.8 ng/mL (ref 30.0–100.0)

## 2017-01-23 ENCOUNTER — Encounter: Payer: Self-pay | Admitting: Cardiovascular Disease

## 2017-01-23 ENCOUNTER — Ambulatory Visit (INDEPENDENT_AMBULATORY_CARE_PROVIDER_SITE_OTHER): Payer: BLUE CROSS/BLUE SHIELD | Admitting: Cardiovascular Disease

## 2017-01-23 VITALS — BP 117/80 | HR 82 | Ht 63.0 in | Wt 179.2 lb

## 2017-01-23 DIAGNOSIS — I10 Essential (primary) hypertension: Secondary | ICD-10-CM | POA: Diagnosis not present

## 2017-01-23 NOTE — Patient Instructions (Signed)
Medication Instructions: Your physician recommends that you continue on your current medications as directed. Please refer to the Current Medication list given to you today.  If you need a refill on your cardiac medications before your next appointment, please call your pharmacy.   Follow-Up: Your physician wants you to follow-up as needed   Thank you for choosing Heartcare at [facility]!!      

## 2017-01-23 NOTE — Progress Notes (Signed)
Cardiology Office Note   Date:  01/23/2017   ID:  [name], [name] Mar 19, 1966, MRN [id number]  PCP:  Dian Queen, MD  Cardiologist:   Kathlyn Sacramento, MD   No chief complaint on file.     History of Present Illness: Haley Flores is a 51 y.o. female who was referred by Dr. Brigitte Pulse for evaluation of borderline pulmonary hypertension. She has known history of essential hypertension and prediabetes. She had an echocardiogram done in August for a cardiac murmur which showed normal LV systolic function with an EF of 69-62%, grade 1 diastolic dysfunction, mild tricuspid regurgitation with borderline pulmonary hypertension with peak systolic pressure of 35 mmHg. She denies any chest pain or shortness of breath. No palpitations. She had hypertension for about 10 years. She is not a smoker but was exposed to secondhand smoking. She has no family history of premature coronary artery disease or heart failure. She works in [employer] information [employer] manager. She reports poor quality sleep but no episodes of apnea. She has no official diagnosis of sleep apnea.   Past Medical History:  Diagnosis Date  . Hypertension     Past Surgical History:  Procedure Laterality Date  . ABDOMINAL HYSTERECTOMY       Current Outpatient Prescriptions  Medication Sig Dispense Refill  . benazepril (LOTENSIN) 20 MG tablet Take 20 mg by mouth daily.    . cetirizine (ZYRTEC) 10 MG tablet Take 10 mg by mouth at bedtime as needed for allergies.    . Estradiol (DIVIGEL) 0.25 MG/0.25GM GEL Place onto the skin daily.    . hydrochlorothiazide (MICROZIDE) 12.5 MG capsule Take 1 capsule (12.5 mg total) by mouth daily. 30 capsule 1   No current facility-administered medications for this visit.     Allergies:   Patient has no known allergies.    Social History:  The patient  reports that she has never smoked. She has never used smokeless tobacco. She reports that she drinks alcohol. She reports that she does not use drugs.   Family  History:  The patient's family history includes Hyperlipidemia in her mother; Hypertension in her father and mother.    ROS:  Please see the history of present illness.   Otherwise, review of systems are positive for none.   All other systems are reviewed and negative.    PHYSICAL EXAM: VS:  BP 117/80 (BP Location: Right Arm)   Pulse 82   Ht 5\' 3"  (1.6 m)   Wt 179 lb 3.2 oz (81.3 kg)   BMI 31.74 kg/m  , BMI Body mass index is 31.74 kg/m. GEN: Well nourished, well developed, in no acute distress  HEENT: normal  Neck: no JVD, carotid bruits, or masses Cardiac: RRR; no murmurs, rubs, or gallops,no edema  Respiratory:  clear to auscultation bilaterally, normal work of breathing GI: soft, nontender, nondistended, + BS MS: no deformity or atrophy  Skin: warm and dry, no rash Neuro:  Strength and sensation are intact Psych: euthymic mood, full affect   EKG:  EKG is ordered today. The ekg ordered today demonstrates normal sinus rhythm with incomplete right bundle branch block.   Recent Labs: 09/12/2016: ALT 16; BUN 17; Creatinine 0.8; Potassium 4.4; Sodium 136 11/06/2016: Hemoglobin 12.9; Platelets 370 12/30/2016: TSH 2.160    Lipid Panel    Component Value Date/Time   CHOL 233 (A) 09/12/2016   TRIG 100 09/12/2016   LDLCALC 163 09/12/2016      Wt Readings from Last 3 Encounters:  01/23/17 179  lb 3.2 oz (81.3 kg)  12/30/16 176 lb (79.8 kg)  11/10/16 179 lb 9.6 oz (81.5 kg)       PAD Screen 01/23/2017  Previous PAD dx? Yes  Previous surgical procedure? No  Pain with walking? No  Feet/toe relief with dangling? No  Painful, non-healing ulcers? No  Extremities discolored? No      ASSESSMENT AND PLAN:  1.  Borderline pulmonary hypertension: Systolic pulmonary pressure was at the upper limit of normal. This is likely due to mild diastolic dysfunction and known history of essential hypertension. She is completely asymptomatic and at this level of elevation, this by itself  does not require further investigation. Continue blood pressure control.  2. Essential hypertension: Blood pressure is controlled. I discussed with her the importance of healthy lifestyle changes including low sodium diet, weight loss and exercise.    Disposition:   FU with me as needed.   Signed,  Kathlyn Sacramento, MD  01/23/2017 10:09 AM    [place]

## 2017-05-10 DIAGNOSIS — Z01419 Encounter for gynecological examination (general) (routine) without abnormal findings: Secondary | ICD-10-CM | POA: Diagnosis not present

## 2017-05-10 DIAGNOSIS — Z1231 Encounter for screening mammogram for malignant neoplasm of breast: Secondary | ICD-10-CM | POA: Diagnosis not present

## 2017-05-10 DIAGNOSIS — Z6832 Body mass index (BMI) 32.0-32.9, adult: Secondary | ICD-10-CM | POA: Diagnosis not present

## 2017-05-10 DIAGNOSIS — N760 Acute vaginitis: Secondary | ICD-10-CM | POA: Diagnosis not present

## 2017-05-24 ENCOUNTER — Encounter: Payer: Self-pay | Admitting: Internal Medicine

## 2017-07-13 ENCOUNTER — Other Ambulatory Visit: Payer: Self-pay

## 2017-07-13 ENCOUNTER — Ambulatory Visit (AMBULATORY_SURGERY_CENTER): Payer: Self-pay | Admitting: *Deleted

## 2017-07-13 ENCOUNTER — Encounter: Payer: Self-pay | Admitting: Internal Medicine

## 2017-07-13 VITALS — Ht 63.0 in | Wt 179.0 lb

## 2017-07-13 DIAGNOSIS — Z1211 Encounter for screening for malignant neoplasm of colon: Secondary | ICD-10-CM

## 2017-07-13 NOTE — Progress Notes (Signed)
No egg or soy allergy known to patient  No issues with past sedation with any surgeries  or procedures, no intubation problems   pt does take phentermine diet pills- pt aware to stop for 10 days prior  No home 02 use per patient  No blood thinners per patient  Pt denies issues with constipation  No A fib or A flutter  EMMI video sent to pt's e mail

## 2017-07-27 ENCOUNTER — Encounter: Payer: Self-pay | Admitting: Internal Medicine

## 2017-07-27 ENCOUNTER — Ambulatory Visit (AMBULATORY_SURGERY_CENTER): Payer: BLUE CROSS/BLUE SHIELD | Admitting: Internal Medicine

## 2017-07-27 ENCOUNTER — Other Ambulatory Visit: Payer: Self-pay

## 2017-07-27 VITALS — BP 124/79 | HR 70 | Temp 97.3°F | Resp 11 | Ht 63.0 in | Wt 179.0 lb

## 2017-07-27 DIAGNOSIS — Z1211 Encounter for screening for malignant neoplasm of colon: Secondary | ICD-10-CM | POA: Diagnosis not present

## 2017-07-27 DIAGNOSIS — Z1212 Encounter for screening for malignant neoplasm of rectum: Secondary | ICD-10-CM

## 2017-07-27 DIAGNOSIS — D123 Benign neoplasm of transverse colon: Secondary | ICD-10-CM

## 2017-07-27 MED ORDER — SODIUM CHLORIDE 0.9 % IV SOLN: 500.0000 mL | Freq: Once | INTRAVENOUS | Status: DC

## 2017-07-27 NOTE — Progress Notes (Signed)
A and O x3. Report to RN. Tolerated MAC anesthesia well.

## 2017-07-27 NOTE — Progress Notes (Signed)
Called to room to assist during endoscopic procedure.  Patient ID and intended procedure confirmed with present staff. Received instructions for my participation in the procedure from the performing physician.  

## 2017-07-27 NOTE — Op Note (Signed)
[facility] Patient Name: Haley Flores Procedure Date: 07/27/2017 8:40 AM MRN: [id number] Endoscopist: Gatha Mayer , MD Age: 52 Referring MD:  Date of Birth: 21-Feb-1966 Gender: Female Account #: [id number] Procedure:                Colonoscopy Indications:              Screening for colorectal malignant neoplasm, This                            is the patient's first colonoscopy Medicines:                Propofol per Anesthesia, Monitored Anesthesia Care Procedure:                Pre-Anesthesia Assessment:                           - Prior to the procedure, a History and Physical                            was performed, and patient medications and                            allergies were reviewed. The patient's tolerance of                            previous anesthesia was also reviewed. The risks                            and benefits of the procedure and the sedation                            options and risks were discussed with the patient.                            All questions were answered, and informed consent                            was obtained. Prior Anticoagulants: The patient has                            taken no previous anticoagulant or antiplatelet                            agents. ASA Grade Assessment: II - A patient with                            mild systemic disease. After reviewing the risks                            and benefits, the patient was deemed in                            satisfactory condition to undergo the procedure.  After obtaining informed consent, the colonoscope                            was passed under direct vision. Throughout the                            procedure, the patient's blood pressure, pulse, and                            oxygen saturations were monitored continuously. The                            Colonoscope was introduced through the anus and   advanced to the the cecum, identified by                            appendiceal orifice and ileocecal valve. The                            colonoscopy was performed without difficulty. The                            patient tolerated the procedure well. The quality                            of the bowel preparation was excellent. The bowel                            preparation used was Miralax. The ileocecal valve,                            appendiceal orifice, and rectum were photographed. Scope In: 8:41:02 AM Scope Out: 8:54:52 AM Scope Withdrawal Time: 0 hours 11 minutes 4 seconds  Total Procedure Duration: 0 hours 13 minutes 50 seconds  Findings:                 The perianal and digital rectal examinations were                            normal.                           A 5 mm polyp was found in the transverse colon. The                            polyp was sessile. The polyp was removed with a                            cold snare. Resection and retrieval were complete.                            Verification of patient identification for the                            specimen  was done. Estimated blood loss was minimal.                           Multiple diverticula were found in the sigmoid                            colon.                           The exam was otherwise without abnormality on                            direct and retroflexion views. Complications:            No immediate complications. Estimated Blood Loss:     Estimated blood loss: none. Impression:               - One 5 mm polyp in the transverse colon. Resected                            and retrieved. Recommendation:           - Patient has a contact number available for                            emergencies. The signs and symptoms of potential                            delayed complications were discussed with the                            patient. Return to normal activities tomorrow.                             Written discharge instructions were provided to the                            patient.                           - Resume previous diet.                           - Continue present medications.                           - Repeat colonoscopy is recommended [Repeat                            reason]. The colonoscopy date will be determined                            after pathology results from today's exam become                            available for review. Gatha Mayer, MD 07/27/2017 9:08:45 AM This report has been signed electronically.

## 2017-07-27 NOTE — Progress Notes (Signed)
Pt's states no medical or surgical changes since previsit or office visit. 

## 2017-07-27 NOTE — Patient Instructions (Addendum)
I found and removed one tiny polyp.  You also have a condition called diverticulosis - common and not usually a problem. Please read the handout provided.  I appreciate the opportunity to care for you. Gatha Mayer, MD, [facility]  Polyp handout given to patient. Diverticulosis handout given to patient.  Resume previous diet. Continue present medications.  Repeat colonoscopy recommended for surveillance.  Date to be determined after pathology results reviewed.  YOU HAD AN ENDOSCOPIC PROCEDURE TODAY AT [facility]:   Refer to the procedure report that was given to you for any specific questions about what was found during the examination.  If the procedure report does not answer your questions, please call your gastroenterologist to clarify.  If you requested that your care partner not be given the details of your procedure findings, then the procedure report has been included in a sealed envelope for you to review at your convenience later.  YOU SHOULD EXPECT: Some feelings of bloating in the abdomen. Passage of more gas than usual.  Walking can help get rid of the air that was put into your GI tract during the procedure and reduce the bloating. If you had a lower endoscopy (such as a colonoscopy or flexible sigmoidoscopy) you may notice spotting of blood in your stool or on the toilet paper. If you underwent a bowel prep for your procedure, you may not have a normal bowel movement for a few days.  Please Note:  You might notice some irritation and congestion in your nose or some drainage.  This is from the oxygen used during your procedure.  There is no need for concern and it should clear up in a day or so.  SYMPTOMS TO REPORT IMMEDIATELY:   Following lower endoscopy (colonoscopy or flexible sigmoidoscopy):  Excessive amounts of blood in the stool  Significant tenderness or worsening of abdominal pains  Swelling of the abdomen that is new, acute  Fever of 100F or  higher  For urgent or emergent issues, a gastroenterologist can be reached at any hour by calling [phone].   DIET:  We do recommend a small meal at first, but then you may proceed to your regular diet.  Drink plenty of fluids but you should avoid alcoholic beverages for 24 hours.  ACTIVITY:  You should plan to take it easy for the rest of today and you should NOT DRIVE or use heavy machinery until tomorrow (because of the sedation medicines used during the test).    FOLLOW UP: Our staff will call the number listed on your records the next business day following your procedure to check on you and address any questions or concerns that you may have regarding the information given to you following your procedure. If we do not reach you, we will leave a message.  However, if you are feeling well and you are not experiencing any problems, there is no need to return our call.  We will assume that you have returned to your regular daily activities without incident.  If any biopsies were taken you will be contacted by phone or by letter within the next 1-3 weeks.  Please call us at [phone] if you have not heard about the biopsies in 3 weeks.    SIGNATURES/CONFIDENTIALITY: You and/or your care partner have signed paperwork which will be entered into your electronic medical record.  These signatures attest to the fact that that the information above on your After Visit Summary has been reviewed  and is understood.  Full responsibility of the confidentiality of this discharge information lies with you and/or your care-partner.

## 2017-07-30 ENCOUNTER — Telehealth: Payer: Self-pay | Admitting: *Deleted

## 2017-07-30 NOTE — Telephone Encounter (Signed)
  Follow up Call-  Call back number 07/27/2017  Post procedure Call Back phone  # [phone]  Permission to leave phone message Yes  Some recent data might be hidden     Patient questions:  Message left to call us if necessary.

## 2017-07-30 NOTE — Telephone Encounter (Signed)
  Follow up Call-  Call back number 07/27/2017  Post procedure Call Back phone  # [phone]  Permission to leave phone message Yes  Some recent data might be hidden     Patient questions:  Message left to call us if necessary. Second call.

## 2017-08-03 ENCOUNTER — Encounter: Payer: Self-pay | Admitting: Internal Medicine

## 2017-08-03 DIAGNOSIS — Z86010 Personal history of colonic polyps: Secondary | ICD-10-CM

## 2017-08-03 HISTORY — DX: Personal history of colonic polyps: Z86.010

## 2017-08-03 NOTE — Progress Notes (Signed)
5 mm adenoma recall 2024

## 2018-05-14 DIAGNOSIS — N958 Other specified menopausal and perimenopausal disorders: Secondary | ICD-10-CM | POA: Diagnosis not present

## 2018-05-14 DIAGNOSIS — Z01419 Encounter for gynecological examination (general) (routine) without abnormal findings: Secondary | ICD-10-CM | POA: Diagnosis not present

## 2018-05-14 DIAGNOSIS — Z0000 Encounter for general adult medical examination without abnormal findings: Secondary | ICD-10-CM | POA: Diagnosis not present

## 2018-05-14 DIAGNOSIS — Z13820 Encounter for screening for osteoporosis: Secondary | ICD-10-CM | POA: Diagnosis not present

## 2018-05-14 DIAGNOSIS — Z6832 Body mass index (BMI) 32.0-32.9, adult: Secondary | ICD-10-CM | POA: Diagnosis not present

## 2018-05-14 DIAGNOSIS — Z1239 Encounter for other screening for malignant neoplasm of breast: Secondary | ICD-10-CM | POA: Diagnosis not present

## 2018-05-14 DIAGNOSIS — Z1231 Encounter for screening mammogram for malignant neoplasm of breast: Secondary | ICD-10-CM | POA: Diagnosis not present

## 2018-10-24 DIAGNOSIS — L858 Other specified epidermal thickening: Secondary | ICD-10-CM | POA: Diagnosis not present

## 2018-11-06 DIAGNOSIS — I8312 Varicose veins of left lower extremity with inflammation: Secondary | ICD-10-CM | POA: Diagnosis not present

## 2018-11-06 DIAGNOSIS — I890 Lymphedema, not elsewhere classified: Secondary | ICD-10-CM | POA: Diagnosis not present

## 2018-11-06 DIAGNOSIS — I83813 Varicose veins of bilateral lower extremities with pain: Secondary | ICD-10-CM | POA: Diagnosis not present

## 2018-11-06 DIAGNOSIS — I8311 Varicose veins of right lower extremity with inflammation: Secondary | ICD-10-CM | POA: Diagnosis not present

## 2018-11-20 DIAGNOSIS — I8311 Varicose veins of right lower extremity with inflammation: Secondary | ICD-10-CM | POA: Diagnosis not present

## 2018-11-20 DIAGNOSIS — I8312 Varicose veins of left lower extremity with inflammation: Secondary | ICD-10-CM | POA: Diagnosis not present

## 2018-11-25 DIAGNOSIS — I83813 Varicose veins of bilateral lower extremities with pain: Secondary | ICD-10-CM | POA: Diagnosis not present

## 2018-11-25 DIAGNOSIS — I8311 Varicose veins of right lower extremity with inflammation: Secondary | ICD-10-CM | POA: Diagnosis not present

## 2018-11-25 DIAGNOSIS — I8312 Varicose veins of left lower extremity with inflammation: Secondary | ICD-10-CM | POA: Diagnosis not present

## 2018-11-25 DIAGNOSIS — I83893 Varicose veins of bilateral lower extremities with other complications: Secondary | ICD-10-CM | POA: Diagnosis not present

## 2018-11-27 IMAGING — DX DG CHEST 2V
2 series · 2 of 2 positions shown · non-contrast
Comparison: None.

CLINICAL DATA: Pulmonary hypertension, cardiac murmur

EXAM:
CHEST  2 VIEW

[chest pa]
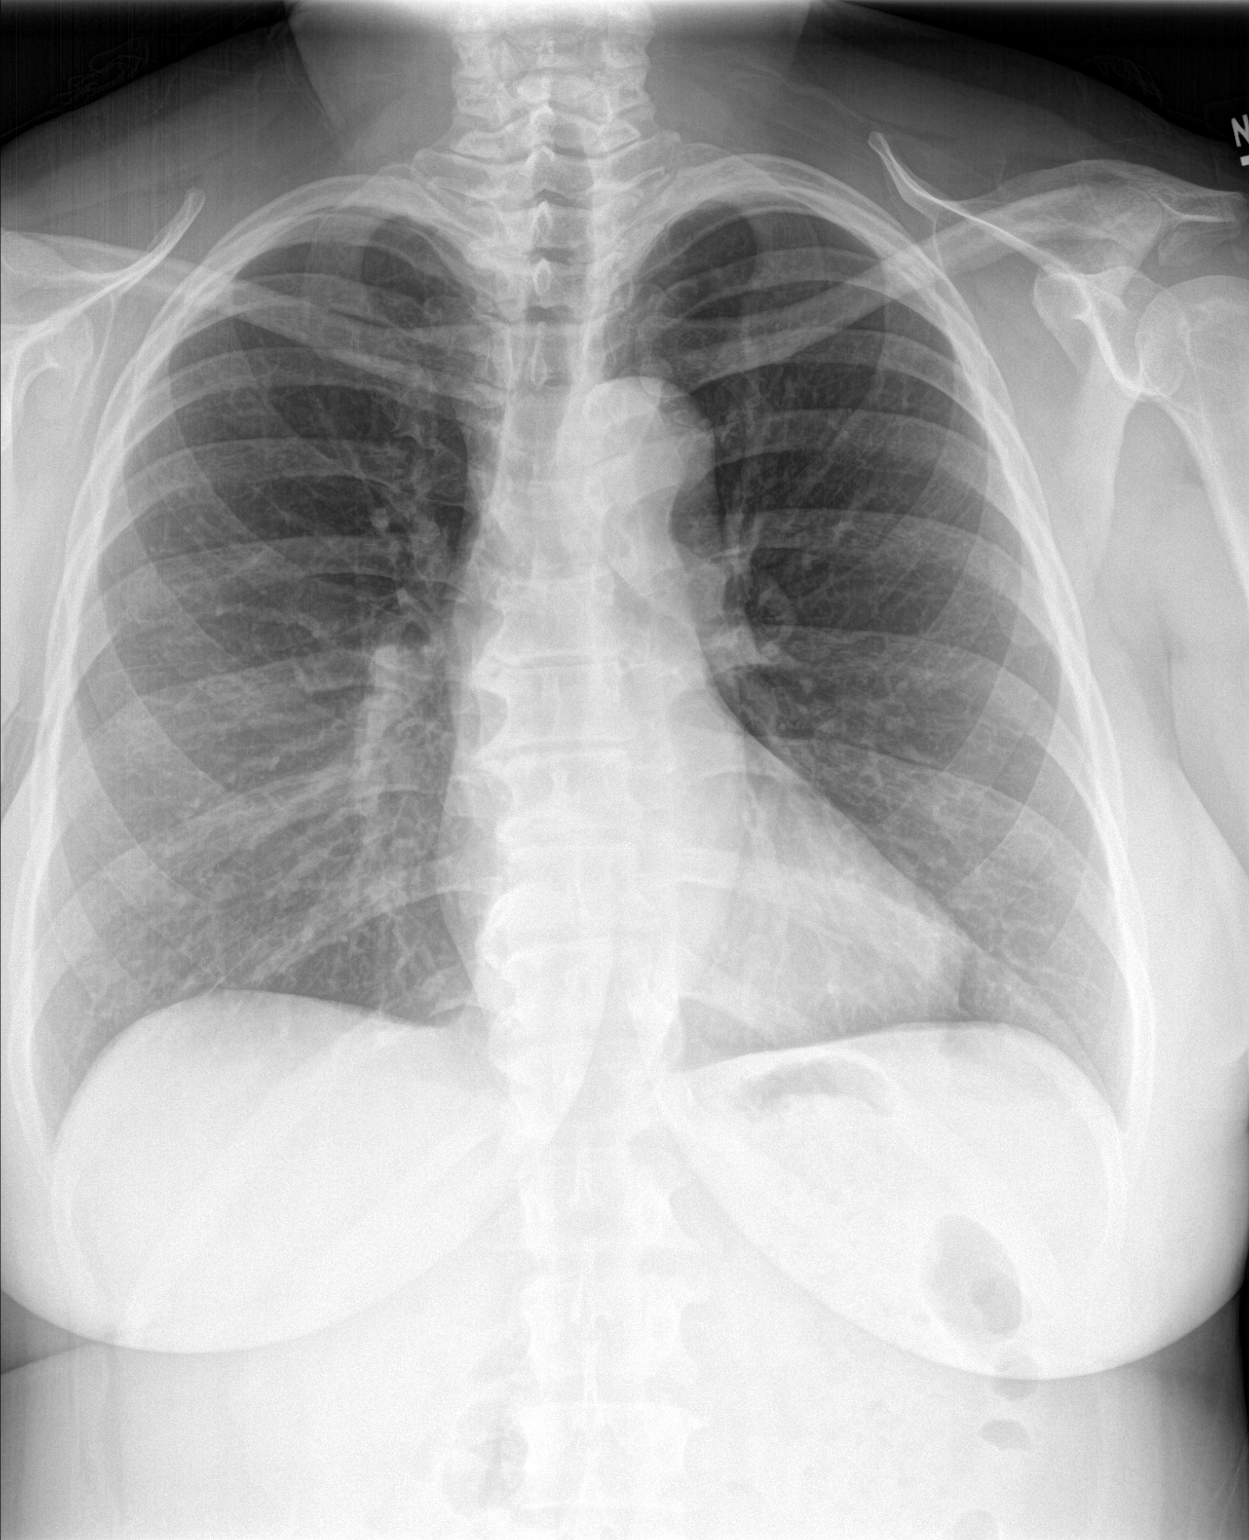

[chest lat]
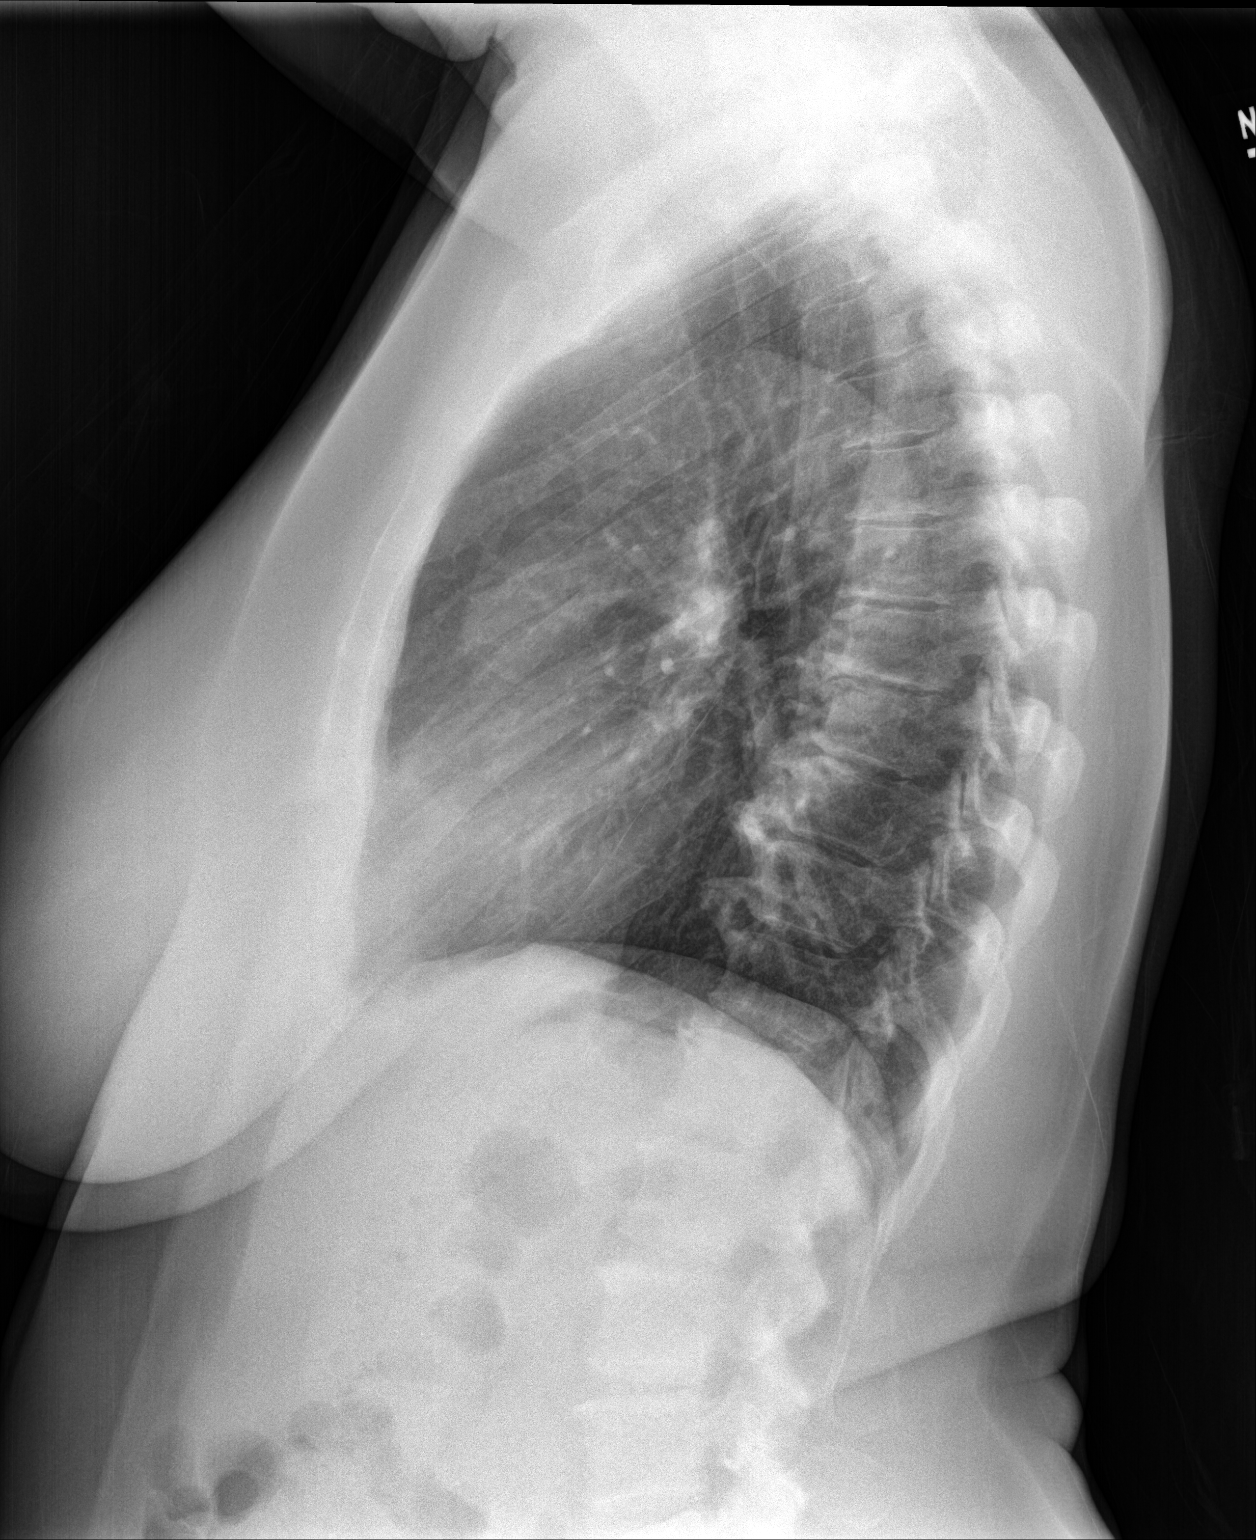

[2 of 2 positions shown; findings below may reference images not displayed]

FINDINGS: Normal heart size. Minimally tortuous thoracic aorta. Otherwise
normal mediastinal contour. No pneumothorax. No pleural effusion.
Lungs appear clear, with no acute consolidative airspace disease and
no pulmonary edema. Moderate thoracic spondylosis .
IMPRESSION: No active cardiopulmonary disease.

## 2018-12-26 DIAGNOSIS — I8312 Varicose veins of left lower extremity with inflammation: Secondary | ICD-10-CM | POA: Diagnosis not present

## 2018-12-26 DIAGNOSIS — I83812 Varicose veins of left lower extremities with pain: Secondary | ICD-10-CM | POA: Diagnosis not present

## 2018-12-27 DIAGNOSIS — I8312 Varicose veins of left lower extremity with inflammation: Secondary | ICD-10-CM | POA: Diagnosis not present

## 2018-12-27 DIAGNOSIS — I83812 Varicose veins of left lower extremities with pain: Secondary | ICD-10-CM | POA: Diagnosis not present

## 2019-01-02 DIAGNOSIS — L858 Other specified epidermal thickening: Secondary | ICD-10-CM | POA: Diagnosis not present

## 2019-01-07 DIAGNOSIS — I8311 Varicose veins of right lower extremity with inflammation: Secondary | ICD-10-CM | POA: Diagnosis not present

## 2019-01-10 DIAGNOSIS — I8311 Varicose veins of right lower extremity with inflammation: Secondary | ICD-10-CM | POA: Diagnosis not present

## 2019-01-29 DIAGNOSIS — I83811 Varicose veins of right lower extremities with pain: Secondary | ICD-10-CM | POA: Diagnosis not present

## 2019-01-29 DIAGNOSIS — I8311 Varicose veins of right lower extremity with inflammation: Secondary | ICD-10-CM | POA: Diagnosis not present

## 2019-02-18 DIAGNOSIS — M7981 Nontraumatic hematoma of soft tissue: Secondary | ICD-10-CM | POA: Diagnosis not present

## 2019-02-18 DIAGNOSIS — I8311 Varicose veins of right lower extremity with inflammation: Secondary | ICD-10-CM | POA: Diagnosis not present

## 2019-03-03 DIAGNOSIS — M7981 Nontraumatic hematoma of soft tissue: Secondary | ICD-10-CM | POA: Diagnosis not present

## 2019-03-03 DIAGNOSIS — I8311 Varicose veins of right lower extremity with inflammation: Secondary | ICD-10-CM | POA: Diagnosis not present

## 2019-03-31 DIAGNOSIS — I8312 Varicose veins of left lower extremity with inflammation: Secondary | ICD-10-CM | POA: Diagnosis not present

## 2019-03-31 DIAGNOSIS — M7981 Nontraumatic hematoma of soft tissue: Secondary | ICD-10-CM | POA: Diagnosis not present

## 2019-04-21 DIAGNOSIS — I8312 Varicose veins of left lower extremity with inflammation: Secondary | ICD-10-CM | POA: Diagnosis not present

## 2022-08-10 ENCOUNTER — Other Ambulatory Visit: Payer: Self-pay | Admitting: Obstetrics and Gynecology

## 2022-08-10 DIAGNOSIS — R928 Other abnormal and inconclusive findings on diagnostic imaging of breast: Secondary | ICD-10-CM

## 2022-08-25 ENCOUNTER — Other Ambulatory Visit: Payer: Self-pay | Admitting: Obstetrics and Gynecology

## 2022-08-25 ENCOUNTER — Ambulatory Visit
Admission: RE | Admit: 2022-08-25 | Discharge: 2022-08-25 | Disposition: A | Discharge status: Home / Self Care | Payer: BC Managed Care – PPO | Source: Ambulatory Visit | Attending: Obstetrics and Gynecology | Admitting: Obstetrics and Gynecology

## 2022-08-25 ENCOUNTER — Ambulatory Visit
Admission: RE | Admit: 2022-08-25 | Discharge: 2022-08-25 | Disposition: A | Discharge status: Home / Self Care | Payer: BLUE CROSS/BLUE SHIELD | Source: Ambulatory Visit | Attending: Obstetrics and Gynecology | Admitting: Obstetrics and Gynecology

## 2022-08-25 DIAGNOSIS — N6320 Unspecified lump in the left breast, unspecified quadrant: Secondary | ICD-10-CM

## 2022-08-25 DIAGNOSIS — R928 Other abnormal and inconclusive findings on diagnostic imaging of breast: Secondary | ICD-10-CM

## 2022-09-01 ENCOUNTER — Ambulatory Visit
Admission: RE | Admit: 2022-09-01 | Discharge: 2022-09-01 | Disposition: A | Discharge status: Home / Self Care | Payer: BC Managed Care – PPO | Source: Ambulatory Visit | Attending: Obstetrics and Gynecology | Admitting: Obstetrics and Gynecology

## 2022-09-01 DIAGNOSIS — R928 Other abnormal and inconclusive findings on diagnostic imaging of breast: Secondary | ICD-10-CM

## 2022-09-01 DIAGNOSIS — N6320 Unspecified lump in the left breast, unspecified quadrant: Secondary | ICD-10-CM

## 2022-09-01 HISTORY — PX: BREAST BIOPSY: SHX20

## 2022-09-05 LAB — LAB REPORT - SCANNED
A1c: 5.9
EGFR: 86

## 2022-09-07 ENCOUNTER — Other Ambulatory Visit: Payer: BC Managed Care – PPO

## 2022-09-21 ENCOUNTER — Other Ambulatory Visit: Payer: Self-pay | Admitting: Internal Medicine

## 2022-09-22 ENCOUNTER — Encounter: Payer: Self-pay | Admitting: Internal Medicine

## 2023-01-26 ENCOUNTER — Encounter: Payer: Self-pay | Admitting: Podiatry

## 2023-01-26 ENCOUNTER — Ambulatory Visit: Payer: BC Managed Care – PPO | Admitting: Podiatry

## 2023-01-26 VITALS — Ht 63.0 in | Wt 180.0 lb

## 2023-01-26 DIAGNOSIS — M21612 Bunion of left foot: Secondary | ICD-10-CM | POA: Diagnosis not present

## 2023-01-26 DIAGNOSIS — L603 Nail dystrophy: Secondary | ICD-10-CM

## 2023-01-26 DIAGNOSIS — B351 Tinea unguium: Secondary | ICD-10-CM

## 2023-01-26 MED ORDER — CICLOPIROX 8 % EX SOLN
Freq: Every day | CUTANEOUS | 0 refills | Status: DC
Start: 2023-01-26 — End: 2023-02-14

## 2023-01-26 MED ORDER — TERBINAFINE HCL 250 MG PO TABS: 250.0000 mg | ORAL_TABLET | Freq: Every day | ORAL | 2 refills | Status: AC

## 2023-01-26 NOTE — Patient Instructions (Signed)

## 2023-01-26 NOTE — Progress Notes (Signed)
  Subjective:  Patient ID: Haley Flores, female    DOB: 1965-06-11,  MRN: [id number]  Chief Complaint  Patient presents with   Nail Problem    Patient is here for bilateral nail fungus, been there for a while    57 y.o. female presents with concern for bilateral nail fungal infection.  So the nails are growing abnormally had thickening and yellow discoloration.  Also concerned about a bunion deformity on the left foot.  Does cause her some pain occasionally but not hurting all the time at this point  Past Medical History:  Diagnosis Date   Allergy    possible    GERD (gastroesophageal reflux disease)    yrs ago    Heart murmur    cardio did not hear a murmur    Hx of adenomatous polyp of colon 08/03/2017   Hypertension    Phlebitis    lower right leg     No Known Allergies  ROS: Negative except as per HPI above  Objective:  General: AAO x3, NAD  Dermatological: Dystrophy and abnormal yellow discoloration with subungual debris and ridging present on the bilateral toenails.  Vascular:  Dorsalis Pedis artery and Posterior Tibial artery pedal pulses are 2/4 bilateral.  Capillary fill time < 3 sec to all digits.   Neruologic: Grossly intact via light touch bilateral. Protective threshold intact to all sites bilateral.  830  Musculoskeletal: Hallux abductovalgus deformity present on the left foot.  Pain with palpation of the medial eminence  Gait: Unassisted, Nonantalgic.   No images are attached to the encounter.  Radiographs:    Assessment:   1. Onychomycosis   2. Bunion of left foot   3. Nail dystrophy      Plan:  Patient was evaluated and treated and all questions answered.  # Onychomycosis of nails x 5 bilateral foot Onychomycosis -Educated on etiology of nail fungus. -Nail sample taken for microbiology and histology. -Patient will have blood work drawn at her place of work and have the results faxed to my office to check for liver function -eRx for oral  terbinafine 250 mg take once daily for 90 days #30 with 2 refill. Educated on risks and benefits of the medication.  Risk of hepatic toxicity  -eRx for Penlac topical antifungal use this daily for the next 3 months  # Bunion of left foot -Briefly discussed bunion deformity with the patient and went over conservative and treat surgical treatment options -Patient will consider surgery in between now and her next appointment and we will workup further at next appointment if she wishes to proceed -For now recommend wider toebox shoes and gel padding over the medial eminence.  Return in about 3 months (around 04/27/2023) for f/u onycomycosis treatment.          Corinna Gab, DPM [facility] / [facility]

## 2023-02-14 ENCOUNTER — Other Ambulatory Visit: Payer: Self-pay

## 2023-02-14 MED ORDER — CICLOPIROX 8 % EX SOLN: Freq: Every day | CUTANEOUS | 0 refills | Status: DC

## 2023-02-15 ENCOUNTER — Other Ambulatory Visit: Payer: Self-pay | Admitting: Podiatry

## 2023-02-19 ENCOUNTER — Other Ambulatory Visit: Payer: Self-pay | Admitting: Podiatry

## 2023-03-15 ENCOUNTER — Telehealth: Payer: Self-pay

## 2023-03-15 NOTE — Telephone Encounter (Signed)
Patient called and left a message - the terbinafine is causing some nausea. Patient wants to know if she should  D/C and is there something else she can do to treat her fungus -thanks

## 2023-03-28 ENCOUNTER — Other Ambulatory Visit: Payer: Self-pay | Admitting: Podiatry

## 2023-04-27 ENCOUNTER — Ambulatory Visit: Payer: BC Managed Care – PPO | Admitting: Podiatry

## 2023-05-04 ENCOUNTER — Ambulatory Visit (INDEPENDENT_AMBULATORY_CARE_PROVIDER_SITE_OTHER): Payer: BC Managed Care – PPO | Admitting: Podiatry

## 2023-05-04 DIAGNOSIS — B351 Tinea unguium: Secondary | ICD-10-CM | POA: Diagnosis not present

## 2023-05-04 DIAGNOSIS — L603 Nail dystrophy: Secondary | ICD-10-CM

## 2023-05-04 DIAGNOSIS — M21612 Bunion of left foot: Secondary | ICD-10-CM

## 2023-05-04 MED ORDER — CICLOPIROX 8 % EX SOLN: Freq: Every day | CUTANEOUS | 0 refills | Status: AC

## 2023-05-04 NOTE — Patient Instructions (Addendum)
 More silicone pads can be purchased from:  [email or web address]    Recommend the use of good supportive stiff soled sneaker.  Good reliable shoe brands include but are not limited to [employer], [employer], new balance.  Shoe market on [address] is a good job of getting her feet appropriately for shoes and making good recommendations.  May also try formula 7 for dystrophic fungal toenails.

## 2023-05-04 NOTE — Progress Notes (Signed)
 Subjective:  Patient ID: Haley Flores, female    DOB: 17-Jun-1965,  MRN: [id number]  Chief Complaint  Patient presents with   Foot Care    RFC. Not diabetic, no anticoag. Needs nails trimmed. Has callus on LT outer near 5th hallux.     58 y.o. female presents as follow up for bilateral nail fungal infection.  She states that she had nausea from the oral terbinafine  however she is continued with the ciclopirox .  She has noticed some improvement with this.  Her cultures were negative for fungal growth.  She states that the left foot bunion has not been bothering her more she would like to proceed with conservative care. Also complaining of callus left sub 5th metatarsal head.  Past Medical History:  Diagnosis Date   Allergy    possible    GERD (gastroesophageal reflux disease)    yrs ago    Heart murmur    cardio did not hear a murmur    Hx of adenomatous polyp of colon 08/03/2017   Hypertension    Phlebitis    lower right leg     No Known Allergies  ROS: Negative except as per HPI above  Objective:  General: AAO x3, NAD  Dermatological: Dystrophy and abnormal yellow discoloration with subungual debris and ridging present on the bilateral toenails. Thickening and discoloration to the 5th toenails bilaterally.  Some proximal clear nail growth noted to the right hallux nail.  Mild callus formation to the left fifth metatarsal head plantarly and to the distal medial hallux adjacent to the nail plate bilaterally.  Vascular:  Dorsalis Pedis artery and Posterior Tibial artery pedal pulses are 2/4 bilateral.  Capillary fill time < 3 sec to all digits.   Neruologic: Grossly intact via light touch bilateral. Protective threshold intact to all sites bilateral.  Musculoskeletal: Hallux abductovalgus deformity present on the left foot.  Minimal Pain with palpation of the medial eminence.  Prominent fifth met base left foot plantarly.  Gait: Unassisted, Nonantalgic.   No images are attached  to the encounter.  Radiographs: deferred.   Assessment:   1. Nail dystrophy   2. Onychomycosis   3. Bunion of left foot      Plan:  Patient was evaluated and treated and all questions answered.  # Onychomycosis of nails x 5 bilateral foot Onychomycosis -Nails x 10 were debrided in thickness and length today - Patient has seen some improvement from the Penlac , will order a refill. - Did discuss negative culture results with the patient.  Advised that after this refill if she is noticed some improvement but her condition has not resolved she can try over-the-counter formula 7. - Did discuss that removal of the affected toenails could be an option but they are still causing her problems going forward  # Bunion of left foot -Patient would like to proceed with conservative care. - Discussed good supportive shoes with wide toe box  Calluses: Hyperkeratotic lesions x 3 were sharply debrided using 312 scalpel blade to patient tolerance as a courtesy today.  Discussed strategies for home management of the calluses including over-the-counter urea cream's, home remedies for callus softening, use of an emery board or pumice stone to file them down and use of padding or accommodation in shoe gear.  No follow-ups on file.          Ethan Saddler, DPM [facility] / [facility]

## 2023-05-11 ENCOUNTER — Other Ambulatory Visit: Payer: Self-pay | Admitting: Podiatry
